# Patient Record
Sex: Male | Born: 1975 | Race: White | Hispanic: No | Marital: Married | Smoking: Never smoker
Health system: Southern US, Community
[De-identification: ages and names within clinical notes are randomized; demographics above are authoritative.]

## PROBLEM LIST (undated history)

## (undated) DIAGNOSIS — J45909 Unspecified asthma, uncomplicated: Secondary | ICD-10-CM

## (undated) DIAGNOSIS — D229 Melanocytic nevi, unspecified: Secondary | ICD-10-CM

## (undated) DIAGNOSIS — J309 Allergic rhinitis, unspecified: Secondary | ICD-10-CM

## (undated) HISTORY — PX: CYST REMOVAL NECK: SHX6281

## (undated) HISTORY — DX: Allergic rhinitis, unspecified: J30.9

## (undated) HISTORY — DX: Melanocytic nevi, unspecified: D22.9

## (undated) HISTORY — DX: Unspecified asthma, uncomplicated: J45.909

---

## 2010-10-05 ENCOUNTER — Other Ambulatory Visit: Payer: Self-pay | Admitting: Family Medicine

## 2010-10-06 ENCOUNTER — Ambulatory Visit
Admission: RE | Admit: 2010-10-06 | Discharge: 2010-10-06 | Disposition: A | Discharge status: Home / Self Care | Payer: PRIVATE HEALTH INSURANCE | Source: Ambulatory Visit | Attending: Family Medicine | Admitting: Family Medicine

## 2012-05-29 IMAGING — RF DG ESOPHAGUS
4 series · 20 of 24 positions shown · non-contrast
Comparison: None.

CLINICAL DATA: Reflux

ESOPHOGRAM/BARIUM SWALLOW
TECHNIQUE: Combined double contrast and single contrast
examination performed using effervescent crystals, thick barium
liquid, and thin barium liquid.

[Series 1: run · 5 of 19 slices shown (1 of 4)]
[im 1/19]
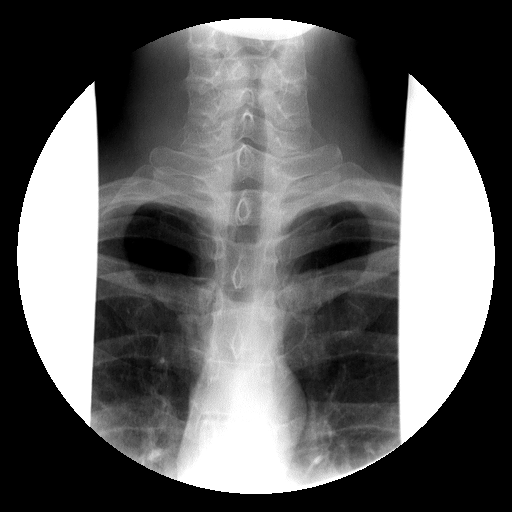
[im 4/19]
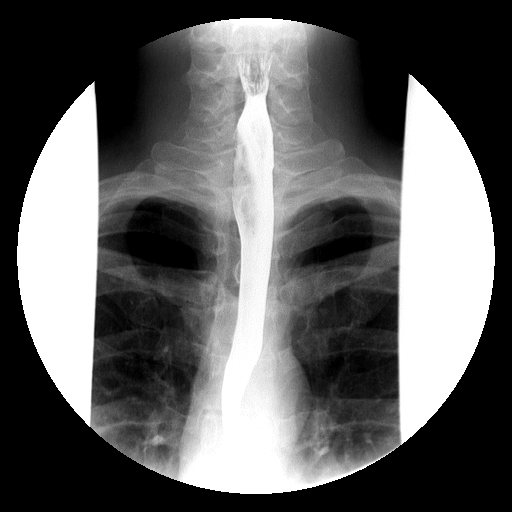
[im 11/19]
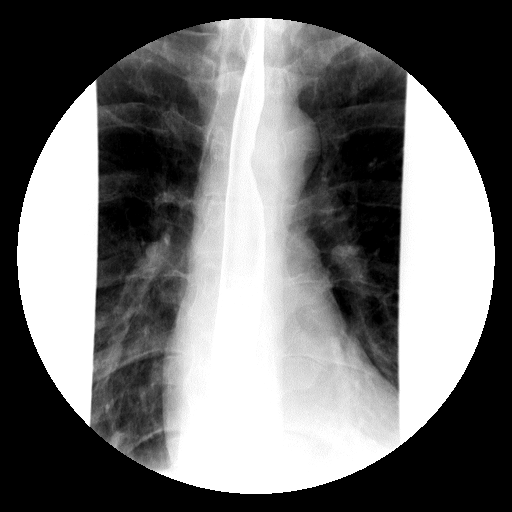
[im 15/19]
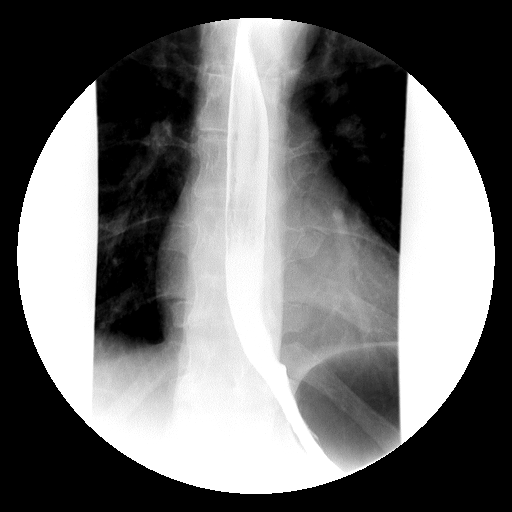
[im 19/19]
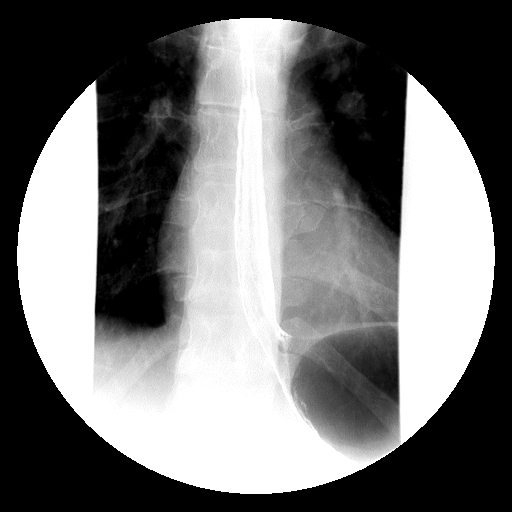

[Series 2: run · 6 of 23 slices shown (2 of 4)]
[im 1/23]
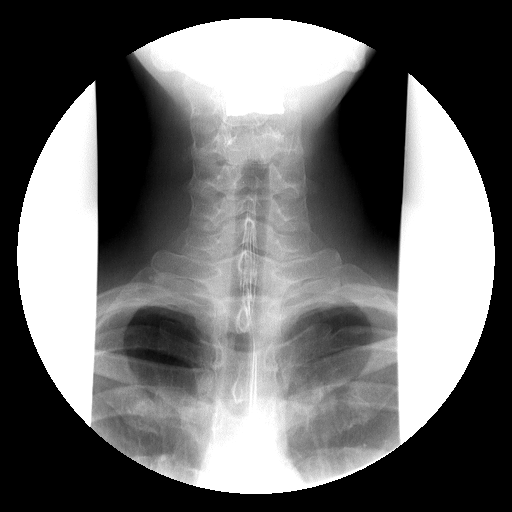
[im 4/23]
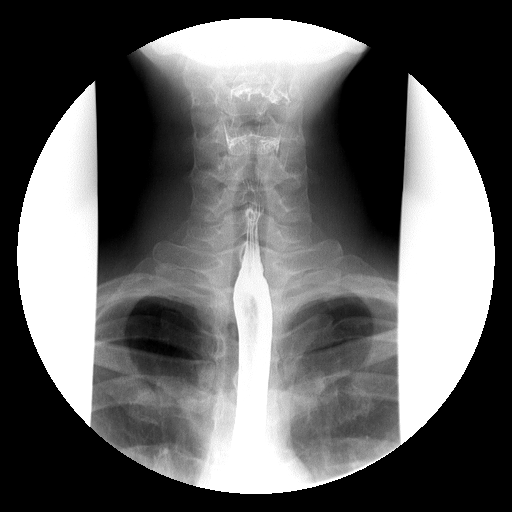
[im 12/23]
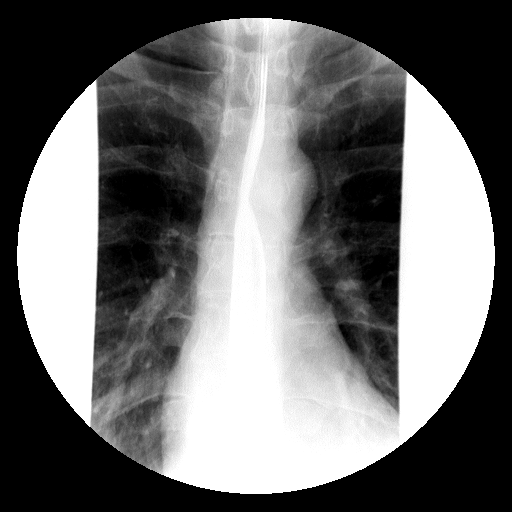
[im 15/23]
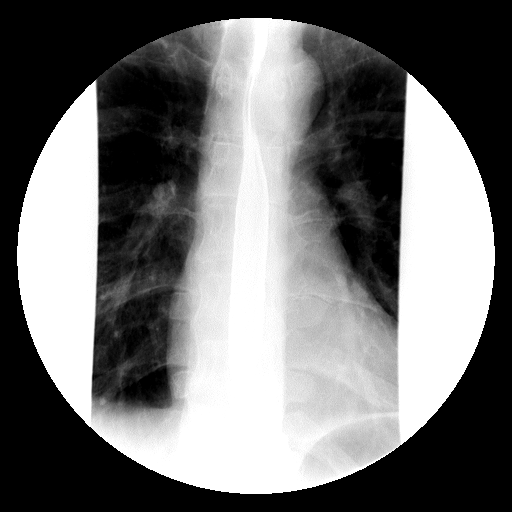
[im 19/23]
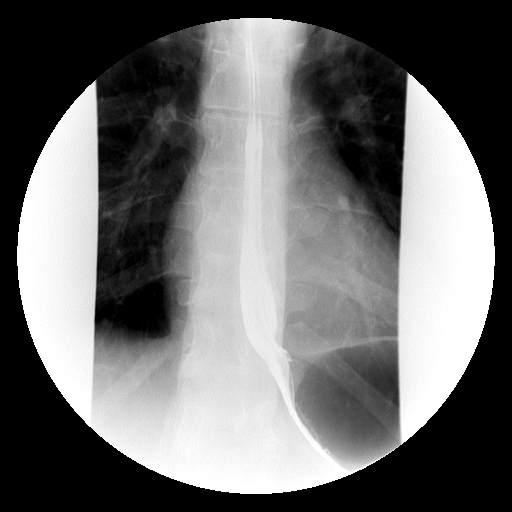
[im 23/23]
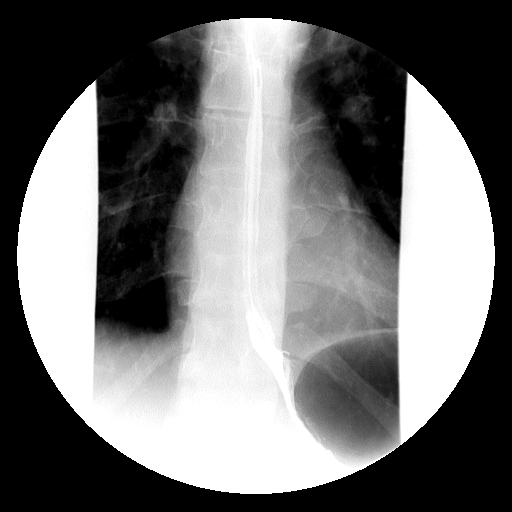

[Series 3: run · 4 of 18 slices shown (3 of 4)]
[im 1/18]
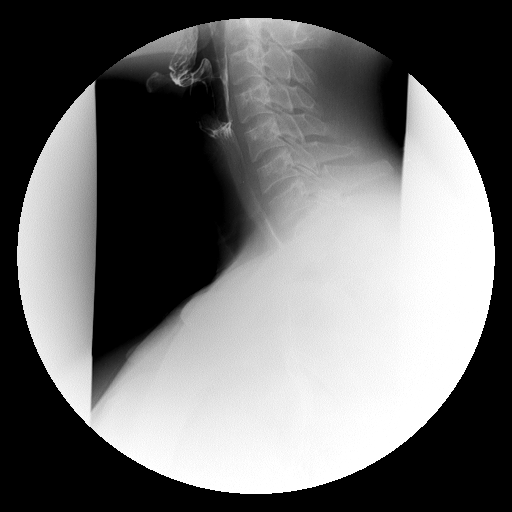
[im 9/18]
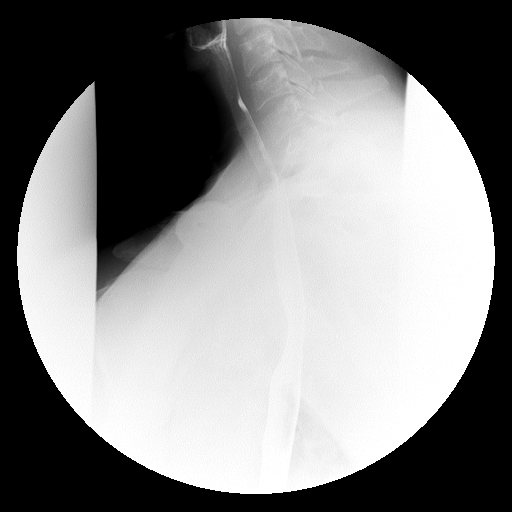
[im 13/18]
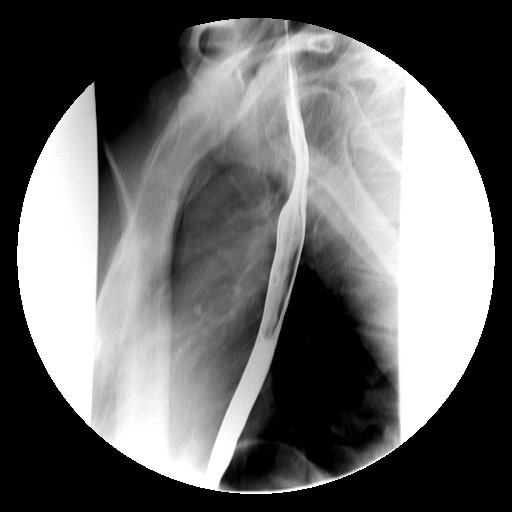
[im 18/18]
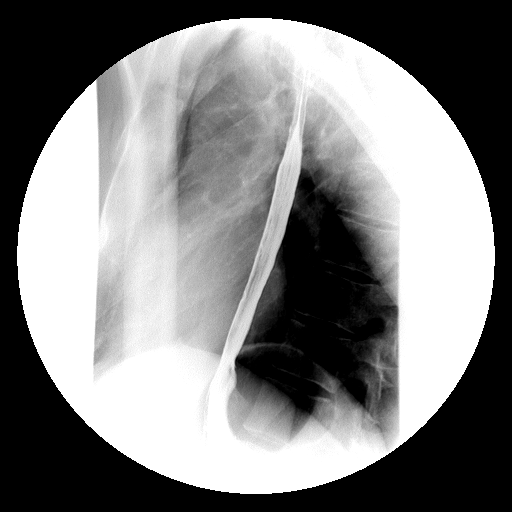

[Series 4: run · 5 of 21 slices shown (4 of 4)]
[im 1/21]
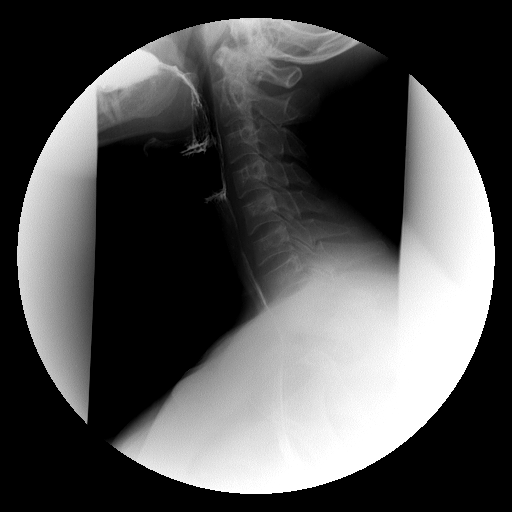
[im 5/21]
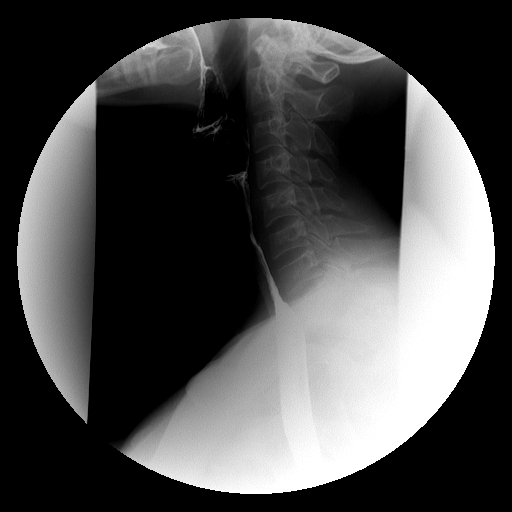
[im 13/21]
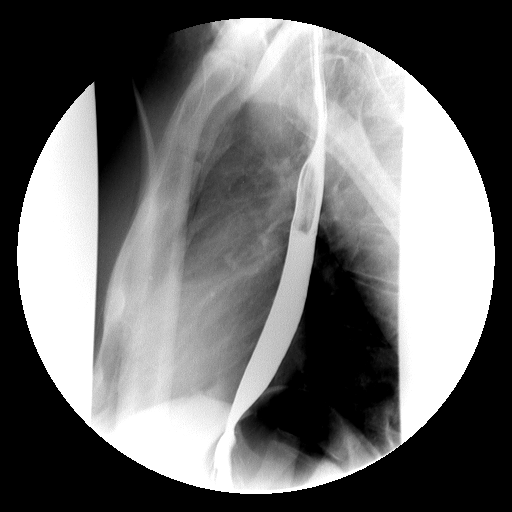
[im 17/21]
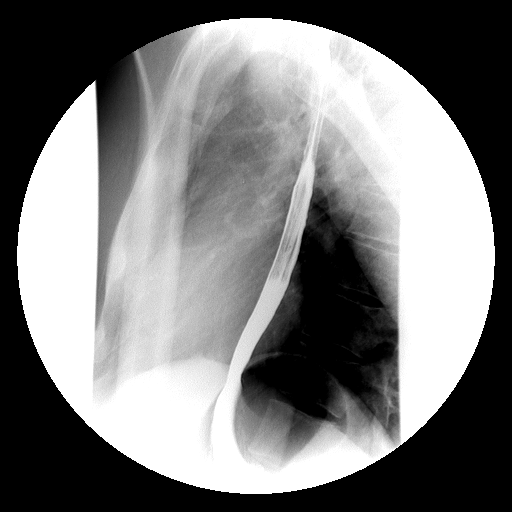
[im 21/21]
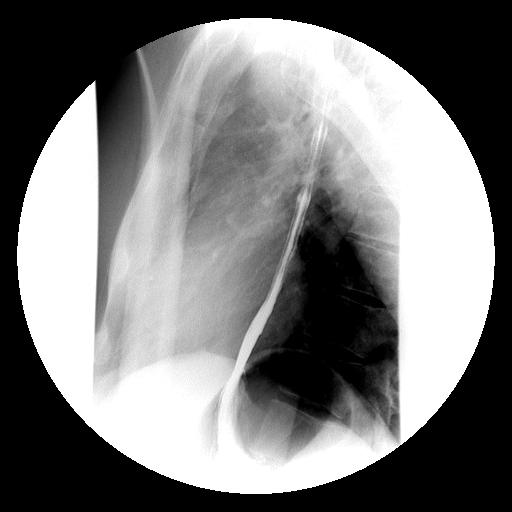

[20 of 24 positions shown; findings below may reference images not displayed]

FINDINGS: The esophagus has a normal appearance with no evidence
of stricture, ulceration, fixed filling defect.  The mucosa is
normal appearing.  There is no narrowing at the EG junction.  No
reflux was seen during this exam.
IMPRESSION: Normal esophagram.

## 2016-04-19 ENCOUNTER — Encounter (INDEPENDENT_AMBULATORY_CARE_PROVIDER_SITE_OTHER): Payer: Self-pay | Admitting: Orthopaedic Surgery

## 2016-04-19 ENCOUNTER — Ambulatory Visit (INDEPENDENT_AMBULATORY_CARE_PROVIDER_SITE_OTHER): Payer: Self-pay

## 2016-04-19 ENCOUNTER — Ambulatory Visit (INDEPENDENT_AMBULATORY_CARE_PROVIDER_SITE_OTHER): Payer: BLUE CROSS/BLUE SHIELD | Admitting: Orthopaedic Surgery

## 2016-04-19 VITALS — BP 131/94 | HR 75 | Ht 72.0 in | Wt 165.0 lb

## 2016-04-19 DIAGNOSIS — M25512 Pain in left shoulder: Secondary | ICD-10-CM | POA: Diagnosis not present

## 2016-04-19 DIAGNOSIS — M542 Cervicalgia: Secondary | ICD-10-CM | POA: Diagnosis not present

## 2016-04-19 NOTE — Progress Notes (Signed)
Office Visit Note   Patient: Christian Carroll           Date of Birth: Dec 24, 1975           MRN: [id number] Visit Date: 04/19/2016              Requested by: No referring provider defined for this encounter. PCP: Stephens Shire, MD   Assessment & Plan: Visit Diagnoses:  1. Neck pain   2. Left shoulder pain, unspecified chronicity     Plan: Return if he develops radicular symptoms. We discussed these in detail. We discussed working on some stress  induction with the walking program cardiovascular fitness work. Based on his physical examination findings and do not think MRI is indicated at this point.  Follow-Up Instructions: Return if symptoms worsen or fail to improve.   Orders:  Orders Placed This Encounter  Procedures  . XR Cervical Spine 2 or 3 views  . XR Shoulder Left   No orders of the defined types were placed in this encounter.     Procedures: No procedures performed   Clinical Data: No additional findings.   Subjective: Chief Complaint  Patient presents with  . Left Shoulder - Pain  . Neck - Pain    Patient has been having some soreness in the muscle in his left shoulder and up into left neck. This is ongoing x about 3 years. There is no known injury.  He states that this gets inflamed and tight. He does try to do some stretching which seems to help.  He states it is more of a discomfort than a pain.  He states if he does a lot of overhead work, he can feel that muscle left side across anterior neck. Stretching his arms back seems to make it feel better. He may take an occasional Ibuprofen if needed. He saw a chiropractor about 2 years ago, which did provide some relief.  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  Review of Systems 14 point review of systems updated reviewed and is normal other than as mentioned in this   Objective: Vital Signs: BP (!) 131/94   Pulse 75   Ht 6'  (1.829 m)   Wt 165 lb (74.8 kg)   BMI 22.38 kg/m   Physical Exam  Constitutional: He is oriented to person, place, and time. He appears well-developed and well-nourished.  HENT:  Head: Normocephalic and atraumatic.  Eyes: EOM are normal. Pupils are equal, round, and reactive to light.  Neck: No tracheal deviation present. No thyromegaly present.  Cardiovascular: Normal rate.   Pulmonary/Chest: Effort normal. He has no wheezes.  Abdominal: Soft. Bowel sounds are normal.  Neurological: He is alert and oriented to person, place, and time.  Skin: Skin is warm and dry. Capillary refill takes less than 2 seconds.  Psychiatric: He has a normal mood and affect. His behavior is normal. Judgment and thought content normal.    Ortho Exam normal cervical range of motion Spurling negative for me. In flexion-extension. Negative impingement of the shoulder. Negative drop arm test empty  can test is normal. Normal heel to toe gait reflexes are normal no isolated motor weakness. No evidence of radiculopathy. Elbow reached full extension no synovitis no skin changes  Specialty Comments:  No specialty comments available.  Imaging: Xr Cervical Spine 2 Or 3 Views  Result Date: 04/19/2016 Cervical spine x-rays AP and lateral reviewed. Mild peaking of the uncovertebral joints  at C5-6. Normal cervical lordosis no disc space narrowing no spondylolisthesis Impression: Essentially normal cervical spine for his age. Trace changes at C5-6  Xr Shoulder Left  Result Date: 04/19/2016 Left shoulder x-rays AP and lateral oblique view are reviewed. This shows normal shoulder joint bony contour is normal no arthritic changes. Before meals joint is normal Impression :left shoulder x-rays    PMFS History: There are no active problems to display for this patient.  History reviewed. No pertinent past medical history.  History reviewed. No pertinent family history.  History reviewed. No pertinent surgical  history. Social History   Occupational History  . Not on file.   Social History Main Topics  . Smoking status: Never Smoker  . Smokeless tobacco: Never Used  . Alcohol use Yes     Comment: occasional  . Drug use: No  . Sexual activity: Not on file

## 2016-05-03 ENCOUNTER — Telehealth (INDEPENDENT_AMBULATORY_CARE_PROVIDER_SITE_OTHER): Payer: Self-pay | Admitting: Orthopaedic Surgery

## 2016-05-03 NOTE — Telephone Encounter (Signed)
Patient request disc of neck & upper spine xray.

## 2016-05-05 NOTE — Telephone Encounter (Signed)
Patient is aware that CD has been made and ready for pick up.

## 2017-12-18 DIAGNOSIS — D229 Melanocytic nevi, unspecified: Secondary | ICD-10-CM

## 2017-12-18 HISTORY — DX: Melanocytic nevi, unspecified: D22.9

## 2018-09-12 ENCOUNTER — Encounter: Payer: Self-pay | Admitting: *Deleted

## 2019-05-06 ENCOUNTER — Ambulatory Visit: Payer: 59 | Admitting: Pediatrics

## 2019-05-06 ENCOUNTER — Encounter: Payer: Self-pay | Admitting: Pediatrics

## 2019-05-06 ENCOUNTER — Other Ambulatory Visit: Payer: Self-pay

## 2019-05-06 VITALS — BP 130/78 | HR 74 | Temp 97.8°F | Resp 16 | Ht 71.0 in | Wt 162.0 lb

## 2019-05-06 DIAGNOSIS — J3089 Other allergic rhinitis: Secondary | ICD-10-CM

## 2019-05-06 DIAGNOSIS — J4530 Mild persistent asthma, uncomplicated: Secondary | ICD-10-CM

## 2019-05-06 DIAGNOSIS — H1010 Acute atopic conjunctivitis, unspecified eye: Secondary | ICD-10-CM | POA: Diagnosis not present

## 2019-05-06 MED ORDER — ALBUTEROL SULFATE HFA 108 (90 BASE) MCG/ACT IN AERS: 2.0000 | INHALATION_SPRAY | RESPIRATORY_TRACT | 1 refills | Status: DC | PRN

## 2019-05-06 MED ORDER — MONTELUKAST SODIUM 10 MG PO TABS: ORAL_TABLET | ORAL | 5 refills | Status: DC

## 2019-05-06 NOTE — Patient Instructions (Addendum)
Environmental control of dust and mold Zyrtec 10 mg-take 1 tablet once a day if needed for runny nose or itchy eyes Fluticasone 2 sprays per nostril once a day if needed for stuffy nose Opcon-A-1 drop 3 times a day if needed for itchy eyes  Montelukast 10 mg-take 1 tablet once a day to prevent coughing or wheezing Ventolin 2 puffs every 4 hours if needed for wheezing or coughing spells.  You may use Ventolin 2 puffs 5 to 15 minutes before exercise Add prednisone 20 mg twice a day for 3 days, 20 mg on the fourth day, 10 mg on the fifth day to bring your allergic symptoms under control  Polysporin ointment twice a day for 1 week in  the areas that have a little scabbing after working outside.

## 2019-05-06 NOTE — Progress Notes (Signed)
[address] [place] [address] Dept: [phone]  New Patient Note  Patient ID: Christian Carroll, male    DOB: Nov 01, 1975  Age: 43 y.o. MRN: [id number] Date of Office Visit: 05/06/2019 Referring provider: Stephens Shire, MD [address] [place],  [place] [address]    Chief Complaint: Allergic Rhinitis  (history of allergy injections as a child) and Asthma  HPI Christian Carroll presents for for an allergy evaluation.  He had asthma in childhood and his symptoms improved dramatically with the use of allergy injections.  During the past few months he has noted some wheezing and shortness of breath.  He has had seasonal allergic rhinitis since childhood and usually Zyrtec D improves his symptoms.  He has aggravation of his symptoms on exposure to dust , cigarette smoke , molds , cat and dog.  He does not have a history of eczema or chronic urticaria.  He has never had pneumonia.  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   HENT:       Seasonal allergic rhinitis since childhood  Eyes:       Itchy at times  Respiratory:       Asthma as a child.  Some coughing and wheezing recently  Cardiovascular: Negative.   Gastrointestinal:       Occasional heartburn treated with Prilosec as needed  Genitourinary: Negative.   Musculoskeletal: Negative.   Skin:       Photosensitive reaction to NSAIDs  Neurological: Negative.   Endo/Heme/Allergies:       No diabetes or thyroid disease.  As a young child he was stung in one  foot and had large local swelling only.  Subsequent insect stings have not caused any problems.  Psychiatric/Behavioral: Negative.     Outpatient Encounter Medications as of 05/06/2019  Medication Sig  . cetirizine-pseudoephedrine (ZYRTEC-D) 5-120 MG tablet Take 1 tablet by mouth 2 (two) times daily.  . fluticasone (FLONASE) 50 MCG/ACT nasal spray Place into the  nose.  . ibuprofen (ADVIL,MOTRIN) 200 MG tablet Take 200 mg by mouth every 6 (six) hours as needed. prn  . albuterol (VENTOLIN HFA) 108 (90 Base) MCG/ACT inhaler Inhale 2 puffs into the lungs every 4 (four) hours as needed for wheezing or shortness of breath.  . montelukast (SINGULAIR) 10 MG tablet Take 1 tablet once a day for coughing or wheezing.  . [DISCONTINUED] fluticasone (FLONASE) 50 MCG/ACT nasal spray Place into both nostrils daily.   No facility-administered encounter medications on file as of 05/06/2019.      Drug Allergies:  No Known Allergies  Family History: Trevious's family history is not on file..  Family history is positive for hayfever and sinus problems.  Family history is negative for asthma, angioedema, eczema, hives, food allergies, chronic bronchitis or emphysema.  Social and environmental.  He has a cat that comes in and out of his house.  He is not exposed to cigarette smoking.  He has never smoked cigarettes.  He is an Secondary school teacher.  His house was built in 1901    Physical Exam: BP 130/78 (BP Location: Left Arm, Patient Position: Sitting, Cuff Size: Normal)   Pulse 74   Temp 97.8 F (36.6 C)   Resp 16   Ht 5\' 11"  (1.803 m)   Wt 162 lb 0.6 oz (73.5 kg)   SpO2 97%   BMI 22.60 kg/m    Physical Exam Vitals signs reviewed.  Constitutional:      Appearance: Normal appearance. He is normal weight.  HENT:     Head:     Comments: Eyes normal.  Ears normal.  Nose mild swelling of the nasal turbinates.  Pharynx normal. Neck:     Musculoskeletal: Neck supple.     Comments: No thyromegaly Cardiovascular:     Rate and Rhythm: Normal rate and regular rhythm.     Comments: S1-S2 normal no murmurs Pulmonary:     Comments: Clear to percussion and auscultation Abdominal:     Palpations: Abdomen is soft.     Tenderness: There is no abdominal tenderness.     Comments: No hepatosplenomegaly  Lymphadenopathy:     Cervical: No cervical adenopathy.  Skin:     Comments: About 3 erythematous papulosquamous areas in right neck and thorax after he did a lot of work outside.  These areas were initially itchy  Neurological:     General: No focal deficit present.     Mental Status: He is alert and oriented to person, place, and time. Mental status is at baseline.  Psychiatric:        Mood and Affect: Mood normal.        Behavior: Behavior normal.        Thought Content: Thought content normal.        Judgment: Judgment normal.     Diagnostics: FVC 5.82 L FEV1 4.54 L.  Predicted FVC 5.40 L predicted FEV1 4.27 L.  After albuterol 2 puffs FVC 5.94 L FEV1 4.62 L-the spirometry is in the normal range and there was no significant improvement after albuterol  Allergy skin test were positive to grass pollens, weed pollen, tree pollens, molds.  Mild reactivity to dust mite   Assessment  Assessment and Plan: 1. Mild persistent asthma without complication   2. Other allergic rhinitis   3. Seasonal allergic conjunctivitis     Meds ordered this encounter  Medications  . albuterol (VENTOLIN HFA) 108 (90 Base) MCG/ACT inhaler    Sig: Inhale 2 puffs into the lungs every 4 (four) hours as needed for wheezing or shortness of breath.    Dispense:  18 g    Refill:  1  . montelukast (SINGULAIR) 10 MG tablet    Sig: Take 1 tablet once a day for coughing or wheezing.    Dispense:  34 tablet    Refill:  5    Patient Instructions  Environmental control of dust and mold Zyrtec 10 mg-take 1 tablet once a day if needed for runny nose or itchy eyes Fluticasone 2 sprays per nostril once a day if needed for stuffy nose Opcon-A-1 drop 3 times a day if needed for itchy eyes  Montelukast 10 mg-take 1 tablet once a day to prevent coughing or wheezing Ventolin 2 puffs every 4 hours if needed for wheezing or coughing spells.  You may use Ventolin 2 puffs 5 to 15 minutes before exercise Add prednisone 20 mg twice a day for 3 days, 20 mg on the fourth day, 10 mg on the  fifth day to bring your allergic symptoms under control  Polysporin ointment twice a day for 1 week in  the areas that have a little scabbing after working outside.   Return in about 4 weeks (around 06/03/2019).   Thank you for the opportunity to care for this patient.  Please do not hesitate to contact me with questions.  Penne Lash, M.D.  [facility]  [address] [place], [place] [address] [phone]

## 2019-06-03 ENCOUNTER — Other Ambulatory Visit: Payer: Self-pay

## 2019-06-03 ENCOUNTER — Encounter: Payer: Self-pay | Admitting: Pediatrics

## 2019-06-03 ENCOUNTER — Ambulatory Visit: Payer: 59 | Admitting: Pediatrics

## 2019-06-03 DIAGNOSIS — H1010 Acute atopic conjunctivitis, unspecified eye: Secondary | ICD-10-CM | POA: Diagnosis not present

## 2019-06-03 DIAGNOSIS — J4530 Mild persistent asthma, uncomplicated: Secondary | ICD-10-CM | POA: Diagnosis not present

## 2019-06-03 DIAGNOSIS — J3089 Other allergic rhinitis: Secondary | ICD-10-CM

## 2019-06-03 NOTE — Patient Instructions (Signed)
Zyrtec 10 mg-take 1 tablet once a day if needed for runny nose or itchy eyes or instead Zyrtec-D Fluticasone 2 sprays per nostril once a day if needed for stuffy nose Opcon-A-1 drop 3 times a day if needed for itchy eyes  Montelukast 10 mg-take 1 tablet once a day to prevent coughing or wheezing Ventolin 2 puffs every 4 hours if needed for wheezing or coughing spells.  You may use Ventolin 2 puffs 5 to 15 minutes before exercise  Call us if you are not doing well on this treatment plan

## 2019-06-03 NOTE — Progress Notes (Signed)
  [address] [place] [address] Dept: [phone]  FOLLOW UP NOTE  Patient ID: Christian Carroll, male    DOB: 27-Sep-1975  Age: 43 y.o. MRN: [id number] Date of Office Visit: 06/03/2019  Assessment  Chief Complaint: Allergic Rhinitis  (doing ok. still has to use the zyrtec D at times bc of stuffiness or congestion)  HPI Christian Carroll presents for follow-up of asthma and allergic rhinitis.  Usually he does well by using Zyrtec 10 mg once a day and fluticasone 2 sprays per nostril once a day with regard to his nasal congestion.  Sometimes the nasal congestion is severe and he uses Zyrtec-D.  Instead of Zyrtec.  He has had a flu vaccination.  By  taking montelukast 10 mg once a day he does not have any coughing or wheezing.  He very rarely has to use Ventolin.   Drug Allergies:  No Known Allergies  Physical Exam: There were no vitals taken for this visit.   Physical Exam Vitals reviewed.  Constitutional:      Appearance: Normal appearance. He is normal weight.  HENT:     Head:     Comments: Eyes normal.  Ears normal.  Nose normal.  Pharynx normal. Cardiovascular:     Comments: S1-S2 normal no murmurs Pulmonary:     Comments: Clear to percussion and auscultation Musculoskeletal:     Cervical back: Neck supple.  Lymphadenopathy:     Cervical: No cervical adenopathy.  Neurological:     General: No focal deficit present.     Mental Status: He is alert and oriented to person, place, and time. Mental status is at baseline.  Psychiatric:        Mood and Affect: Mood normal.        Behavior: Behavior normal.        Thought Content: Thought content normal.        Judgment: Judgment normal.     Diagnostics: FVC 5.68 L FEV1 4.51 L.  Predicted FVC 5.40 L predicted FEV1 4.27 L-the spirometry is in the normal range  Assessment and Plan: 1. Mild persistent asthma without complication   2. Other allergic rhinitis   3. Seasonal allergic conjunctivitis     No orders of  the defined types were placed in this encounter.   Patient Instructions  Zyrtec 10 mg-take 1 tablet once a day if needed for runny nose or itchy eyes or instead Zyrtec-D Fluticasone 2 sprays per nostril once a day if needed for stuffy nose Opcon-A-1 drop 3 times a day if needed for itchy eyes  Montelukast 10 mg-take 1 tablet once a day to prevent coughing or wheezing Ventolin 2 puffs every 4 hours if needed for wheezing or coughing spells.  You may use Ventolin 2 puffs 5 to 15 minutes before exercise  Call us if you are not doing well on this treatment plan   Return in about 6 months (around 12/02/2019).    Thank you for the opportunity to care for this patient.  Please do not hesitate to contact me with questions.  Penne Lash, M.D.  [facility] [address] [place], [place] [address] [phone]

## 2019-08-24 ENCOUNTER — Ambulatory Visit: Payer: PRIVATE HEALTH INSURANCE | Attending: Internal Medicine

## 2019-08-24 DIAGNOSIS — Z23 Encounter for immunization: Secondary | ICD-10-CM | POA: Insufficient documentation

## 2019-08-24 NOTE — Progress Notes (Signed)
   Covid-19 Vaccination Clinic  Name:  Christian Carroll    MRN: [id number] DOB: 11/16/1975  08/24/2019  Christian Carroll was observed post Covid-19 immunization for 15 minutes without incident. He was provided with Vaccine Information Sheet and instruction to access the V-Safe system.   Christian Carroll was instructed to call 911 with any severe reactions post vaccine: Marland Kitchen Difficulty breathing  . Swelling of face and throat  . A fast heartbeat  . A bad rash all over body  . Dizziness and weakness   Immunizations Administered    Name Date Dose VIS Date Route   Pfizer COVID-19 Vaccine 08/24/2019 12:21 PM 0.3 mL 05/30/2019 Intramuscular   Manufacturer: [place]   Lot: [id number]   [place]: [id number]

## 2019-09-23 ENCOUNTER — Ambulatory Visit: Payer: PRIVATE HEALTH INSURANCE | Attending: Internal Medicine

## 2019-09-23 DIAGNOSIS — Z23 Encounter for immunization: Secondary | ICD-10-CM

## 2019-09-23 NOTE — Progress Notes (Signed)
   Covid-19 Vaccination Clinic  Name:  Christian Carroll    MRN: [id number] DOB: Mar 20, 1976  09/23/2019  Mr. Christian Carroll was observed post Covid-19 immunization for 15 minutes without incident. He was provided with Vaccine Information Sheet and instruction to access the V-Safe system.   Mr. Christian Carroll was instructed to call 911 with any severe reactions post vaccine: Marland Kitchen Difficulty breathing  . Swelling of face and throat  . A fast heartbeat  . A bad rash all over body  . Dizziness and weakness   Immunizations Administered    Name Date Dose VIS Date Route   Pfizer COVID-19 Vaccine 09/23/2019 11:55 AM 0.3 mL 05/30/2019 Intramuscular   Manufacturer: [place]   Lot: [id number]   [place]: [id number]

## 2019-10-02 ENCOUNTER — Other Ambulatory Visit: Payer: Self-pay | Admitting: Pediatrics

## 2019-10-30 ENCOUNTER — Other Ambulatory Visit: Payer: Self-pay | Admitting: Pediatrics

## 2019-11-12 NOTE — Patient Instructions (Addendum)
Allergic rhinitis Stop Zyrtec and Zyrtec-D Start Xyzal 5 mg take 1 tablet once a day as needed for runny nose and itching. Stop.fluticasone nasal spray Start XHANCE nasal spray doing 2 sprays each nostril twice a day to help with nasal congestion.  Allergic conjunctivitis Continue Opcon-A using 1 drop 3 times a day as needed for itchy watery eyes  Asthma Start ArmonAir Digihailer 55 mcg using 1 puff every 12 hours to help prevent wheeze, shortness of breath and cough. We will re-evaluate this at your 1 month follow up to see if this helps with shortness of breath and wheeze. Continue montelukast 10 mg once a day to help prevent cough and wheeze Use ProAir 2 puffs every 4 hours as needed for coughing, wheezing, tightness in chest or shortness of breath.  Also may use 2 puffs 5 to 15 minutes prior to exercise.  Please let us know if this treatment plan is not working well for you. Continue all other scheduled medications  Schedule follow-up appointment in 1 month

## 2019-11-13 ENCOUNTER — Other Ambulatory Visit: Payer: Self-pay

## 2019-11-13 ENCOUNTER — Ambulatory Visit: Payer: 59 | Admitting: Family Medicine

## 2019-11-13 ENCOUNTER — Encounter: Payer: Self-pay | Admitting: Family Medicine

## 2019-11-13 VITALS — BP 130/82 | HR 76 | Temp 98.5°F | Resp 18 | Ht 72.0 in | Wt 167.2 lb

## 2019-11-13 DIAGNOSIS — J302 Other seasonal allergic rhinitis: Secondary | ICD-10-CM | POA: Diagnosis not present

## 2019-11-13 DIAGNOSIS — H1013 Acute atopic conjunctivitis, bilateral: Secondary | ICD-10-CM | POA: Diagnosis not present

## 2019-11-13 DIAGNOSIS — J4530 Mild persistent asthma, uncomplicated: Secondary | ICD-10-CM

## 2019-11-13 DIAGNOSIS — J3089 Other allergic rhinitis: Secondary | ICD-10-CM

## 2019-11-13 MED ORDER — LEVOCETIRIZINE DIHYDROCHLORIDE 5 MG PO TABS: ORAL_TABLET | ORAL | 5 refills | Status: DC

## 2019-11-13 MED ORDER — ARMONAIR DIGIHALER 55 MCG/ACT IN AEPB: INHALATION_SPRAY | RESPIRATORY_TRACT | 5 refills | Status: DC

## 2019-11-13 MED ORDER — XHANCE 93 MCG/ACT NA EXHU
2.0000 | INHALANT_SUSPENSION | Freq: Two times a day (BID) | NASAL | 5 refills | Status: DC
Start: 2019-11-13 — End: 2019-12-11

## 2019-11-13 MED ORDER — MONTELUKAST SODIUM 10 MG PO TABS: ORAL_TABLET | ORAL | 5 refills | Status: DC

## 2019-11-13 MED ORDER — ALBUTEROL SULFATE HFA 108 (90 BASE) MCG/ACT IN AERS
2.0000 | INHALATION_SPRAY | RESPIRATORY_TRACT | 1 refills | Status: DC | PRN
Start: 2019-11-13 — End: 2020-10-28

## 2019-11-13 NOTE — Progress Notes (Addendum)
[address] [place] [address] Dept: [phone]  FOLLOW UP NOTE  Patient ID: Christian Carroll, male    DOB: 11/14/1975  Age: 44 y.o. MRN: [id number] Date of Office Visit: 11/13/2019  Assessment  Chief Complaint: Asthma  HPI [name] is a 44 year old male that presents for follow-up of allergic rhinitis, allergic conjunctivitis and asthma.  He was last seen on June 03, 2019 by Dr. Shaune Leeks.  Allergic rhinitis is reported as not well controlled.  He reports nasal congestion, postnasal drip, occasional sneezing and denies rhinorrhea.  During pollen season he is having to use Zyrtec D in an on and off pattern to help with nasal congestion.  When he tries to switch to Zyrtec it does not help with his nasal congestion.  Allergic conjunctivitis is reported as controlled.  He reports occasional itchy watery eyes in which Opcon-A helps resolve the symptoms.  Asthma is reported as moderately controlled with the use of montelukast 10 mg once a day and Ventolin.  He reports occasional wheezing and shortness of breath.  He denies any nocturnal awakenings, coughing or tightness in his chest.  He is having to use his albuterol every day if he is on Zyrtec and not using Zyrtec D  due to wheezing and itching in his throat.  He has not required any systemic steroids or any trips to the emergency room or urgent care since his last office visit.  He reports that Ventolin is no longer going to being covered by his insurance.  He reports rare use of Prilosec for heartburn.  He denies any nighttime symptoms and reports rare flareups of heartburn with coffee in the morning or exercise.  Current medications are as listed in the chart.  Drug Allergies:  No Known Allergies   Physical Exam: BP 130/82 (BP Location: Right Arm, Patient Position: Sitting, Cuff Size: Normal)   Pulse 76   Temp 98.5 F (36.9 C) (Oral)   Resp 18   Ht 6' (1.829 m)   Wt 167 lb 3.2 oz (75.8 kg)   SpO2 99%    BMI 22.68 kg/m    Physical Exam Constitutional:      Appearance: Normal appearance.  HENT:     Head: Normocephalic and atraumatic.     Comments: Pharynx normal. Eyes normal. Ears normal. Nose: turbinates moderately edematous and erythematous- Right nostril greater than left nostril.    Right Ear: Tympanic membrane, ear canal and external ear normal.     Left Ear: Tympanic membrane, ear canal and external ear normal.     Mouth/Throat:     Mouth: Mucous membranes are moist.     Pharynx: Oropharynx is clear.  Eyes:     Conjunctiva/sclera: Conjunctivae normal.  Cardiovascular:     Rate and Rhythm: Normal rate and regular rhythm.     Heart sounds: Normal heart sounds.  Pulmonary:     Effort: Pulmonary effort is normal.     Breath sounds: Normal breath sounds.     Comments: Lungs clear to auscultation. Musculoskeletal:     Cervical back: Neck supple.  Skin:    General: Skin is warm and dry.  Neurological:     Mental Status: He is alert and oriented to person, place, and time.  Psychiatric:        Mood and Affect: Mood normal.        Behavior: Behavior normal.        Thought Content: Thought content normal.        Judgment:  Judgment normal.     Diagnostics: FVC 5.88 L, FEV1 4.53 L.  Predicted FVC 5.58 L, FEV1 4.40 L.  Spirometry indicates normal ventilatory function.  Assessment and Plan: 1. Mild persistent asthma without complication   2. Seasonal and perennial allergic rhinitis   3. Allergic conjunctivitis of both eyes     No orders of the defined types were placed in this encounter.   Patient Instructions  Allergic rhinitis Stop Zyrtec and Zyrtec-D Start Xyzal 5 mg take 1 tablet once a day as needed for runny nose and itching. Stop.fluticasone nasal spray Start XHANCE nasal spray doing 2 sprays each nostril twice a day to help with nasal congestion.  Allergic conjunctivitis Continue Opcon-A using 1 drop 3 times a day as needed for itchy watery  eyes  Asthma Start ArmonAir Digihailer 55 mcg using 1 puff every 12 hours to help prevent wheeze, shortness of breath and cough. Continue montelukast 10 mg once a day to help prevent cough and wheeze Use ProAir 2 puffs every 4 hours as needed for coughing, wheezing, tightness in chest or shortness of breath.  Also may use 2 puffs 5 to 15 minutes prior to exercise  Please let us know if this treatment plan is not working well for you. Continue all other scheduled medications  Schedule follow-up appointment in 1 month   Return in about 4 weeks (around 12/11/2019), or if symptoms worsen or fail to improve.    Thank you for the opportunity to care for this patient.  Please do not hesitate to contact me with questions.  Althea Charon, FNP [facility]  ________________________________________________  I have provided oversight concerning [facility]'s evaluation and treatment of this patient's health issues addressed during today's encounter.  I agree with the assessment and therapeutic plan as outlined in the note.   Signed,   R Edgar Frisk, MD

## 2019-11-14 ENCOUNTER — Telehealth: Payer: Self-pay | Admitting: *Deleted

## 2019-11-14 MED ORDER — ARMONAIR RESPICLICK 113 113 MCG/ACT IN AEPB: 1.0000 | INHALATION_SPRAY | Freq: Two times a day (BID) | RESPIRATORY_TRACT | 5 refills | Status: DC

## 2019-11-14 NOTE — Telephone Encounter (Signed)
Can we please change to armonAir 113 with the same instructions

## 2019-11-14 NOTE — Telephone Encounter (Signed)
Pharmacy faxing [place] to say the armonair digihaler 16 has been discontinued. Please advise alternative.

## 2019-11-14 NOTE — Telephone Encounter (Signed)
Okay med sent

## 2019-11-14 NOTE — Telephone Encounter (Signed)
That one is not prefrred also, Armonair [id number] respiclick is preferred.

## 2019-11-14 NOTE — Telephone Encounter (Signed)
Perfect

## 2019-11-18 ENCOUNTER — Ambulatory Visit: Payer: 59 | Admitting: Pediatrics

## 2019-11-18 ENCOUNTER — Telehealth: Payer: Self-pay | Admitting: Family Medicine

## 2019-11-18 NOTE — Telephone Encounter (Signed)
Please advise to another alternative or call drug rep?

## 2019-11-18 NOTE — Telephone Encounter (Signed)
Can you please call in Flovent 110-2 puffs twice a day with a spacer to prevent cough or wheeze. Thank you

## 2019-11-18 NOTE — Telephone Encounter (Signed)
[facility] states Fluticasone Propionate, Inhal, (ARMONAIR RESPICLICK [id number])   Is  on back order, need alternate therapy

## 2019-11-19 ENCOUNTER — Other Ambulatory Visit: Payer: Self-pay

## 2019-11-19 MED ORDER — FLOVENT HFA 110 MCG/ACT IN AERO
2.0000 | INHALATION_SPRAY | Freq: Two times a day (BID) | RESPIRATORY_TRACT | 5 refills | Status: DC
Start: 2019-11-19 — End: 2019-12-11

## 2019-11-19 NOTE — Telephone Encounter (Signed)
Sent in rx and informed pt of me doing so he stated understanding on directions

## 2019-12-01 ENCOUNTER — Telehealth: Payer: Self-pay | Admitting: Family Medicine

## 2019-12-01 NOTE — Telephone Encounter (Signed)
Bridgette from [facility] called to check on PA that was sent over 11/18/2019. PA Line to call back is [phone].  Please advise

## 2019-12-02 NOTE — Telephone Encounter (Signed)
Please advise 

## 2019-12-02 NOTE — Telephone Encounter (Signed)
Patient has [place]. He can try and fail flunisolide as that is what is covered by insurance provider if MD would like to do that. If not then he is still eligible to receive the medication just at the 50.00 copay per month.

## 2019-12-08 MED ORDER — FLUNISOLIDE 25 MCG/ACT (0.025%) NA SOLN
NASAL | 5 refills | Status: DC
Start: 2019-12-08 — End: 2020-03-03

## 2019-12-08 NOTE — Telephone Encounter (Signed)
Prescription has been sent and patient is aware.

## 2019-12-08 NOTE — Telephone Encounter (Signed)
Did you receive this?

## 2019-12-08 NOTE — Telephone Encounter (Signed)
We can try flunisolide 2 sprays in each nostril 2-3 times a day as needed for symptoms of allergic rhinitis

## 2019-12-08 NOTE — Addendum Note (Signed)
Addended by: Lucrezia Starch I on: 12/08/2019 01:24 PM   Modules accepted: Orders

## 2019-12-11 ENCOUNTER — Encounter: Payer: Self-pay | Admitting: Family Medicine

## 2019-12-11 ENCOUNTER — Ambulatory Visit (INDEPENDENT_AMBULATORY_CARE_PROVIDER_SITE_OTHER): Payer: 59 | Admitting: Family Medicine

## 2019-12-11 VITALS — BP 134/90 | HR 64 | Resp 20

## 2019-12-11 DIAGNOSIS — J3089 Other allergic rhinitis: Secondary | ICD-10-CM

## 2019-12-11 DIAGNOSIS — J302 Other seasonal allergic rhinitis: Secondary | ICD-10-CM

## 2019-12-11 DIAGNOSIS — K219 Gastro-esophageal reflux disease without esophagitis: Secondary | ICD-10-CM

## 2019-12-11 DIAGNOSIS — H1013 Acute atopic conjunctivitis, bilateral: Secondary | ICD-10-CM | POA: Diagnosis not present

## 2019-12-11 DIAGNOSIS — J4530 Mild persistent asthma, uncomplicated: Secondary | ICD-10-CM | POA: Diagnosis not present

## 2019-12-11 NOTE — Progress Notes (Signed)
[address] [place] [address] Dept: [phone]  FOLLOW UP NOTE  Patient ID: Yu Peggs, male    DOB: 1976-04-14  Age: 44 y.o. MRN: [id number] Date of Office Visit: 12/11/2019  Assessment  Chief Complaint: Asthma  HPI Bretton Tandy is a 44 year old male who presents to the clinic for a follow up visit. He was last seen in the clinic on 11/13/2019 by Althea Charon, FNP, for evaluation of asthma, allergic rhinitis, allergic conjunctivitis, and reflux.  At today's visit, he reports his asthma has been well controlled with no shortness of breath, occasional wheeze with vigorous activity such as running, and no cough.  He continues to take montelukast 10 mg once a day, Flovent 110-2 puffs once a day with a spacer and occasionally 2 puffs twice a day, and uses albuterol before activity.  He reports allergic rhinitis has been better controlled with XHANCE, saline nasal rinse, and levocetirizine. His insurance will not cover Xhance and he will be changing to flunisolide. He reports excellent nasal steroid application technique in the clinic. He does report dry nares occasionally. Reflux is reported as well controlled with omeprazole 20 mg once a day as needed. His current medications are listed in the chart.    Drug Allergies:  No Known Allergies  Physical Exam: BP 134/90   Pulse 64   Resp 20   SpO2 98%    Physical Exam Vitals reviewed.  Constitutional:      Appearance: Normal appearance.  HENT:     Head: Normocephalic and atraumatic.     Right Ear: Tympanic membrane normal.     Left Ear: Tympanic membrane normal.     Nose:     Comments: Bilateral nares slightly erythematous with clear nasal drainage noted.  Pharynx normal.  Ears normal.  Eyes normal.    Mouth/Throat:     Pharynx: Oropharynx is clear.  Eyes:     Conjunctiva/sclera: Conjunctivae normal.  Cardiovascular:     Rate and Rhythm: Normal rate and regular rhythm.     Heart sounds: Normal heart sounds. No  murmur heard.   Pulmonary:     Effort: Pulmonary effort is normal.     Breath sounds: Normal breath sounds.     Comments: Lungs clear to auscultation Musculoskeletal:        General: Normal range of motion.     Cervical back: Normal range of motion and neck supple.  Skin:    General: Skin is warm and dry.  Neurological:     Mental Status: He is alert and oriented to person, place, and time.  Psychiatric:        Mood and Affect: Mood normal.        Behavior: Behavior normal.        Thought Content: Thought content normal.        Judgment: Judgment normal.     Assessment and Plan: 1. Mild persistent asthma without complication   2. Allergic conjunctivitis of both eyes   3. Seasonal and perennial allergic rhinitis   4. Gastroesophageal reflux disease, unspecified whether esophagitis present     No orders of the defined types were placed in this encounter.   Patient Instructions  Asthma Continue Flovent 110-2 puffs once or twice a day to prevent cough or wheeze Continue montelukast 10 mg once a day to help prevent cough and wheeze Use ProAir 2 puffs every 4 hours as needed for coughing, wheezing, tightness in chest or shortness of breath.  Also may  use 2 puffs 5 to 15 minutes prior to exercise.  Allergic rhinitis Continue Xyzal 5 mg take 1 tablet once a day as needed for runny nose and itching. Continue flunisolide nasal spray doing 2 sprays each nostril twice a day to help with nasal congestion. Consider saline nasal rinses as needed for nasal symptoms. Use this before any medicated nasal sprays for best result Continue allergen avoidance measures directed toward pollens, mold, and dust mites as listed below  Allergic conjunctivitis Continue Opcon-A using 1 drop 3 times a day as needed for itchy watery eyes  Reflux Continue dietary and lifestyle modifications as listed below Continue omeprazole 20 mg once a day as needed for reflux  Please let us know if this treatment  plan is not working well for you.  Follow up in 6 months or sooner if needed.   Return in about 6 months (around 06/11/2020), or if symptoms worsen or fail to improve.    Thank you for the opportunity to care for this patient.  Please do not hesitate to contact me with questions.  Gareth Morgan, FNP [facility]

## 2019-12-11 NOTE — Patient Instructions (Signed)
Asthma Continue Flovent 110-2 puffs once or twice a day to prevent cough or wheeze Continue montelukast 10 mg once a day to help prevent cough and wheeze Use ProAir 2 puffs every 4 hours as needed for coughing, wheezing, tightness in chest or shortness of breath.  Also may use 2 puffs 5 to 15 minutes prior to exercise.  Allergic rhinitis Continue Xyzal 5 mg take 1 tablet once a day as needed for runny nose and itching. Continue flunisolide nasal spray doing 2 sprays each nostril twice a day to help with nasal congestion. Consider saline nasal rinses as needed for nasal symptoms. Use this before any medicated nasal sprays for best result Continue allergen avoidance measures directed toward pollens, mold, and dust mites as listed below  Allergic conjunctivitis Continue Opcon-A using 1 drop 3 times a day as needed for itchy watery eyes  Reflux Continue dietary and lifestyle modifications as listed below Continue omeprazole 20 mg once a day as needed for reflux  Please let us know if this treatment plan is not working well for you.  Follow up in 6 months or sooner if needed.  Reducing Pollen Exposure The American Academy of Allergy, Asthma and Immunology suggests the following steps to reduce your exposure to pollen during allergy seasons. 1. Do not hang sheets or clothing out to dry; pollen may collect on these items. 2. Do not mow lawns or spend time around freshly cut grass; mowing stirs up pollen. 3. Keep windows closed at night.  Keep car windows closed while driving. 4. Minimize morning activities outdoors, a time when pollen counts are usually at their highest. 5. Stay indoors as much as possible when pollen counts or humidity is high and on windy days when pollen tends to remain in the air longer. 6. Use air conditioning when possible.  Many air conditioners have filters that trap the pollen spores. 7. Use a HEPA room air filter to remove pollen form the indoor air you  breathe.   Control of Mold Allergen Mold and fungi can grow on a variety of surfaces provided certain temperature and moisture conditions exist.  Outdoor molds grow on plants, decaying vegetation and soil.  The major outdoor mold, Alternaria and Cladosporium, are found in very high numbers during hot and dry conditions.  Generally, a late Summer - Fall peak is seen for common outdoor fungal spores.  Rain will temporarily lower outdoor mold spore count, but counts rise rapidly when the rainy period ends.  The most important indoor molds are Aspergillus and Penicillium.  Dark, humid and poorly ventilated basements are ideal sites for mold growth.  The next most common sites of mold growth are the bathroom and the kitchen.  Outdoor [employer] 8. Use air conditioning and keep windows closed 9. Avoid exposure to decaying vegetation. 10. Avoid leaf raking. 11. Avoid grain handling. 12. Consider wearing a face mask if working in moldy areas.  Indoor Mold Control 1. Maintain humidity below 50%. 2. Clean washable surfaces with 5% bleach solution. 3. Remove sources e.g. Contaminated carpets.   Control of Dust Mite Allergen Dust mites play a major role in allergic asthma and rhinitis. They occur in environments with high humidity wherever human skin is found. Dust mites absorb humidity from the atmosphere (ie, they do not drink) and feed on organic matter (including shed human and animal skin). Dust mites are a microscopic type of insect that you cannot see with the naked eye. High levels of dust mites have been detected  from mattresses, pillows, carpets, upholstered furniture, bed covers, clothes, soft toys and any woven material. The principal allergen of the dust mite is found in its feces. A gram of dust may contain 1,000 mites and 250,000 fecal particles. Mite antigen is easily measured in the air during house cleaning activities. Dust mites do not bite and do not cause harm to humans, other than by  triggering allergies/asthma.  Ways to decrease your exposure to dust mites in your home:  1. Encase mattresses, box springs and pillows with a mite-impermeable barrier or cover  2. Wash sheets, blankets and drapes weekly in hot water (130 F) with detergent and dry them in a dryer on the hot setting.  3. Have the room cleaned frequently with a vacuum cleaner and a damp dust-mop. For carpeting or rugs, vacuuming with a vacuum cleaner equipped with a high-efficiency particulate air (HEPA) filter. The dust mite allergic individual should not be in a room which is being cleaned and should wait 1 hour after cleaning before going into the room.  4. Do not sleep on upholstered furniture (eg, couches).  5. If possible removing carpeting, upholstered furniture and drapery from the home is ideal. Horizontal blinds should be eliminated in the rooms where the person spends the most time (bedroom, study, television room). Washable vinyl, roller-type shades are optimal.  6. Remove all non-washable stuffed toys from the bedroom. Wash stuffed toys weekly like sheets and blankets above.  7. Reduce indoor humidity to less than 50%. Inexpensive humidity monitors can be purchased at most hardware stores. Do not use a humidifier as can make the problem worse and are not recommended.

## 2019-12-24 ENCOUNTER — Encounter: Payer: Self-pay | Admitting: *Deleted

## 2020-01-02 ENCOUNTER — Other Ambulatory Visit: Payer: Self-pay

## 2020-01-02 ENCOUNTER — Encounter: Payer: Self-pay | Admitting: Physician Assistant

## 2020-01-02 ENCOUNTER — Ambulatory Visit (INDEPENDENT_AMBULATORY_CARE_PROVIDER_SITE_OTHER): Payer: 59 | Admitting: Physician Assistant

## 2020-01-02 DIAGNOSIS — L821 Other seborrheic keratosis: Secondary | ICD-10-CM | POA: Diagnosis not present

## 2020-01-02 DIAGNOSIS — Z1283 Encounter for screening for malignant neoplasm of skin: Secondary | ICD-10-CM | POA: Diagnosis not present

## 2020-01-02 DIAGNOSIS — M67472 Ganglion, left ankle and foot: Secondary | ICD-10-CM | POA: Diagnosis not present

## 2020-01-02 DIAGNOSIS — D171 Benign lipomatous neoplasm of skin and subcutaneous tissue of trunk: Secondary | ICD-10-CM | POA: Diagnosis not present

## 2020-01-02 DIAGNOSIS — M67449 Ganglion, unspecified hand: Secondary | ICD-10-CM

## 2020-01-02 NOTE — Progress Notes (Signed)
   Follow-Up Visit   Subjective  Christian Carroll is a 44 y.o. male who presents for the following: Annual Exam (back - ? cyst, left foot 2nd toes ? cyst as well).   The following portions of the chart were reviewed this encounter and updated as appropriate:     Objective  Well appearing patient in no apparent distress; mood and affect are within normal limits.  A full examination was performed including scalp, head, eyes, ears, nose, lips, neck, chest, axillae, abdomen, back, buttocks, bilateral upper extremities, bilateral lower extremities, hands, feet, fingers, toes, fingernails, and toenails. All findings within normal limits unless otherwise noted below.  Objective  Right Temporal Scalp: Stuck-on, waxy, tan-brown papules and plaques. --Discussed benign etiology and prognosis.   Objective  head to toe: No atypical nevi No signs of non-mole skin cancer.   Objective  Left 3rd Distal Interphalangeal Joint of Toe: Solitary, smooth skin colored to translucent papule.   Assessment & Plan  Lipoma of torso Left Lower Back  Seborrheic keratosis Right Temporal Scalp  observe  Screening exam for skin cancer head to toe  Digital mucous cyst Left 3rd Distal Interphalangeal Joint of Toe  observe   I, Cereniti Curb, PA-C, have reviewed all documentation's for this visit.  The documentation on 01/02/20 for the exam, diagnosis, procedures and orders are all accurate and complete.

## 2020-03-03 ENCOUNTER — Telehealth: Payer: Self-pay | Admitting: Family Medicine

## 2020-03-03 MED ORDER — FLUNISOLIDE 25 MCG/ACT (0.025%) NA SOLN: NASAL | 5 refills | Status: DC

## 2020-03-03 NOTE — Telephone Encounter (Signed)
PT called asking for flunisolide 92mcg need to be sent to [facility] on [address]. [facility]. Please call pt when sent in.

## 2020-03-03 NOTE — Telephone Encounter (Signed)
Refill on flunisolide x 1 with 5 refills at [facility]. Patient informed.

## 2020-05-06 ENCOUNTER — Ambulatory Visit: Payer: 59 | Attending: Internal Medicine

## 2020-05-06 DIAGNOSIS — Z23 Encounter for immunization: Secondary | ICD-10-CM

## 2020-05-06 NOTE — Progress Notes (Signed)
° °  Covid-19 Vaccination Clinic  Name:  Christian Carroll    MRN: [id number] DOB: [birth year]  05/06/2020  Mr. Christian Carroll was observed post Covid-19 immunization for 15 minutes without incident. He was provided with Vaccine Information Sheet and instruction to access the V-Safe system.   Christian Carroll was instructed to call 911 with any severe reactions post vaccine:  Difficulty breathing   Swelling of face and throat   A fast heartbeat   A bad rash all over body   Dizziness and weakness   Immunizations Administered    Name Date Dose VIS Date Route   Pfizer COVID-19 Vaccine 05/06/2020  1:47 PM 0.3 mL 04/07/2020 Intramuscular   Manufacturer: [place]   Lot: [id number]   [place]: [id number]

## 2020-05-24 ENCOUNTER — Other Ambulatory Visit: Payer: 59

## 2020-05-24 DIAGNOSIS — Z20822 Contact with and (suspected) exposure to covid-19: Secondary | ICD-10-CM

## 2020-05-26 LAB — NOVEL CORONAVIRUS, NAA: SARS-CoV-2, NAA: NOT DETECTED

## 2020-05-26 LAB — SARS-COV-2, NAA 2 DAY TAT

## 2020-05-31 ENCOUNTER — Other Ambulatory Visit: Payer: Self-pay | Admitting: Family

## 2020-05-31 NOTE — Patient Instructions (Addendum)
Asthma Continue Flovent 110- 2 puffs once a day to prevent cough or wheeze Continue montelukast 10 mg once a day to help prevent cough and wheeze Continue albuterol 2 puffs every 4 hours as needed for coughing, wheezing, tightness in chest or shortness of breath.  Also may use albuterol 2 puffs 5 to 15 minutes prior to exercise. For asthma flare, begin Flovent 110-2 puffs twice a day with a spacer for 2 weeks or until coguh and wheeze free  Allergic rhinitis Begin Xhance 2 sprays in each nostril twice a day for nasal congestion. This will replace flunisolide nasal spray Continue Xyzal 5 mg take 1 tablet once a day as needed for runny nose and itching. Continue saline nasal rinses as needed for nasal symptoms. Use this before any medicated nasal sprays for best result Continue allergen avoidance measures directed toward pollens, mold, and dust mites as listed below  Allergic conjunctivitis Continue Opcon-A using 1 drop 3 times a day as needed for itchy watery eyes. Some over the counter eye drops include Pataday one drop in each eye once a day as needed for red, itchy eyes OR Zaditor one drop in each eye twice a day as needed for red itchy eyes.  Reflux Continue dietary and lifestyle modifications as listed below Continue omeprazole 20 mg once a day as needed for reflux  Please let us know if this treatment plan is not working well for you.  Follow up in 6 months or sooner if needed.  Reducing Pollen Exposure The American Academy of Allergy, Asthma and Immunology suggests the following steps to reduce your exposure to pollen during allergy seasons. 1. Do not hang sheets or clothing out to dry; pollen may collect on these items. 2. Do not mow lawns or spend time around freshly cut grass; mowing stirs up pollen. 3. Keep windows closed at night.  Keep car windows closed while driving. 4. Minimize morning activities outdoors, a time when pollen counts are usually at their highest. 5. Stay  indoors as much as possible when pollen counts or humidity is high and on windy days when pollen tends to remain in the air longer. 6. Use air conditioning when possible.  Many air conditioners have filters that trap the pollen spores. 7. Use a HEPA room air filter to remove pollen form the indoor air you breathe.   Control of Mold Allergen Mold and fungi can grow on a variety of surfaces provided certain temperature and moisture conditions exist.  Outdoor molds grow on plants, decaying vegetation and soil.  The major outdoor mold, Alternaria and Cladosporium, are found in very high numbers during hot and dry conditions.  Generally, a late Summer - Fall peak is seen for common outdoor fungal spores.  Rain will temporarily lower outdoor mold spore count, but counts rise rapidly when the rainy period ends.  The most important indoor molds are Aspergillus and Penicillium.  Dark, humid and poorly ventilated basements are ideal sites for mold growth.  The next most common sites of mold growth are the bathroom and the kitchen.  Outdoor [employer] 8. Use air conditioning and keep windows closed 9. Avoid exposure to decaying vegetation. 10. Avoid leaf raking. 11. Avoid grain handling. 12. Consider wearing a face mask if working in moldy areas.  Indoor Mold Control 1. Maintain humidity below 50%. 2. Clean washable surfaces with 5% bleach solution. 3. Remove sources e.g. Contaminated carpets.   Control of Dust Mite Allergen Dust mites play a major role in allergic  asthma and rhinitis. They occur in environments with high humidity wherever human skin is found. Dust mites absorb humidity from the atmosphere (ie, they do not drink) and feed on organic matter (including shed human and animal skin). Dust mites are a microscopic type of insect that you cannot see with the naked eye. High levels of dust mites have been detected from mattresses, pillows, carpets, upholstered furniture, bed covers, clothes,  soft toys and any woven material. The principal allergen of the dust mite is found in its feces. A gram of dust may contain 1,000 mites and 250,000 fecal particles. Mite antigen is easily measured in the air during house cleaning activities. Dust mites do not bite and do not cause harm to humans, other than by triggering allergies/asthma.  Ways to decrease your exposure to dust mites in your home:  1. Encase mattresses, box springs and pillows with a mite-impermeable barrier or cover  2. Wash sheets, blankets and drapes weekly in hot water (130 F) with detergent and dry them in a dryer on the hot setting.  3. Have the room cleaned frequently with a vacuum cleaner and a damp dust-mop. For carpeting or rugs, vacuuming with a vacuum cleaner equipped with a high-efficiency particulate air (HEPA) filter. The dust mite allergic individual should not be in a room which is being cleaned and should wait 1 hour after cleaning before going into the room.  4. Do not sleep on upholstered furniture (eg, couches).  5. If possible removing carpeting, upholstered furniture and drapery from the home is ideal. Horizontal blinds should be eliminated in the rooms where the person spends the most time (bedroom, study, television room). Washable vinyl, roller-type shades are optimal.  6. Remove all non-washable stuffed toys from the bedroom. Wash stuffed toys weekly like sheets and blankets above.  7. Reduce indoor humidity to less than 50%. Inexpensive humidity monitors can be purchased at most hardware stores. Do not use a humidifier as can make the problem worse and are not recommended.

## 2020-05-31 NOTE — Telephone Encounter (Signed)
Ok to refill montelukast 10 mg once a day. Quantity 30 with 3 refills. Make sure that he he keeps his follow up appointment with Webb [facility]mith tomorrow.

## 2020-05-31 NOTE — Progress Notes (Addendum)
[address] [place] [address] Dept: [phone]  FOLLOW UP NOTE  Patient ID: Christian Carroll, male    DOB: 1975/12/09  Age: 44 y.o. MRN: [id number] Date of Office Visit: 06/01/2020  Assessment  Chief Complaint: Asthma  HPI Christian Carroll is a 44 year old male who presents to the clinic for a follow up visit. He was last seen in this clinic on 12/11/2019 for evaluation of asthma, allergic rhinitis, allergic conjunctivitis, and reflux. At today's visit, he reports his asthma has been moderately well controlled with shortness of breath and wheezing occurring with vigorous activity and cold weather.  He believes that this is caused by nasal drainage.  He denies cough with rest and activity.  He continues montelukast 10 mg once a day, Flovent 110-2 puffs once a day, and albuterol about 3-4 times a week before activity.  Allergic rhinitis is reported as moderately well controlled with symptoms including nasal congestion and postnasal drainage.  He denies rhinorrhea and sneezing.  He continues Xyzal 5 mg once a day, flunisolide daily, and saline nasal rinses as needed.  He does report that he had much better control of allergic rhinitis while using XHANCE.  Allergic conjunctivitis is reported as moderately well controlled with occasional dry and itchy eyes for which he uses Opcon-A with relief of symptoms.  He denies reflux and does not experience symptoms including heartburn or vomiting.  He continues omeprazole 20 mg as needed.  His current medications are listed in the chart.  Drug Allergies:  No Known Allergies  Physical Exam: BP 106/76 (BP Location: Left Arm, Patient Position: Sitting, Cuff Size: Normal)   Pulse 70   Temp 97.8 F (36.6 C) (Tympanic)   Resp 12   Ht 5\' 11"  (1.803 m)   Wt 167 lb 12.8 oz (76.1 kg)   SpO2 98%   BMI 23.40 kg/m    Physical Exam Vitals reviewed.  Constitutional:      Appearance: Normal appearance.  HENT:     Head: Normocephalic and atraumatic.      Right Ear: Tympanic membrane normal.     Left Ear: Tympanic membrane normal.     Nose:     Comments: Bilateral nares slightly edematous and pale with clear nasal drainage noted.  Pharynx slightly erythematous with no exudate.  Ears normal.  Eyes normal. Eyes:     Conjunctiva/sclera: Conjunctivae normal.  Cardiovascular:     Rate and Rhythm: Normal rate and regular rhythm.     Heart sounds: Normal heart sounds. No murmur heard.   Pulmonary:     Effort: Pulmonary effort is normal.     Breath sounds: Normal breath sounds.     Comments: Lungs clear to auscultation Musculoskeletal:        General: Normal range of motion.     Cervical back: Normal range of motion and neck supple.  Skin:    General: Skin is warm and dry.  Neurological:     Mental Status: He is alert and oriented to person, place, and time.  Psychiatric:        Mood and Affect: Mood normal.        Behavior: Behavior normal.        Thought Content: Thought content normal.        Judgment: Judgment normal.     Diagnostics: FVC 5.63, FEV1 4.39.  Predicted FVC 5.38.  Predicted FEV1 4.24.  Spirometry indicates normal ventilatory function.  Assessment and Plan: 1. Moderate persistent asthma without complication  2. Seasonal and perennial allergic rhinitis   3. Allergic conjunctivitis of both eyes   4. Gastroesophageal reflux disease, unspecified whether esophagitis present     Meds ordered this encounter  Medications  . montelukast (SINGULAIR) 10 MG tablet    Sig: Take one tablet once daily at night for coughing or wheezing.    Dispense:  90 tablet    Refill:  1    Dispense 90 day supply.  . Fluticasone Propionate (XHANCE) 93 MCG/ACT EXHU    Sig: 2 puffs twice daily as needed for stuffy nose.    Dispense:  48 mL    Refill:  1    Patient Instructions  Asthma Continue Flovent 110- 2 puffs once a day to prevent cough or wheeze Continue montelukast 10 mg once a day to help prevent cough and  wheeze Continue albuterol 2 puffs every 4 hours as needed for coughing, wheezing, tightness in chest or shortness of breath.  Also may use albuterol 2 puffs 5 to 15 minutes prior to exercise. For asthma flare, begin Flovent 110-2 puffs twice a day with a spacer for 2 weeks or until coguh and wheeze free  Allergic rhinitis Begin Xhance 2 sprays in each nostril twice a day for nasal congestion. This will replace flunisolide nasal spray Continue Xyzal 5 mg take 1 tablet once a day as needed for runny nose and itching. Continue saline nasal rinses as needed for nasal symptoms. Use this before any medicated nasal sprays for best result Continue allergen avoidance measures directed toward pollens, mold, and dust mites as listed below  Allergic conjunctivitis Continue Opcon-A using 1 drop 3 times a day as needed for itchy watery eyes. Some over the counter eye drops include Pataday one drop in each eye once a day as needed for red, itchy eyes OR Zaditor one drop in each eye twice a day as needed for red itchy eyes.  Reflux Continue dietary and lifestyle modifications as listed below Continue omeprazole 20 mg once a day as needed for reflux  Please let us know if this treatment plan is not working well for you.  Follow up in 6 months or sooner if needed.   Return in about 6 months (around 11/30/2020), or if symptoms worsen or fail to improve.    Thank you for the opportunity to care for this patient.  Please do not hesitate to contact me with questions.  Gareth Morgan, FNP [facility]  ________________________________________________  I have provided oversight concerning [facility]'s evaluation and treatment of this patient's health issues addressed during today's encounter.  I agree with the assessment and therapeutic plan as outlined in the note.   Signed,   R Edgar Frisk, MD

## 2020-06-01 ENCOUNTER — Other Ambulatory Visit: Payer: Self-pay

## 2020-06-01 ENCOUNTER — Ambulatory Visit: Payer: 59 | Admitting: Family Medicine

## 2020-06-01 ENCOUNTER — Encounter: Payer: Self-pay | Admitting: Family Medicine

## 2020-06-01 VITALS — BP 106/76 | HR 70 | Temp 97.8°F | Resp 12 | Ht 71.0 in | Wt 167.8 lb

## 2020-06-01 DIAGNOSIS — J302 Other seasonal allergic rhinitis: Secondary | ICD-10-CM

## 2020-06-01 DIAGNOSIS — J4540 Moderate persistent asthma, uncomplicated: Secondary | ICD-10-CM | POA: Diagnosis not present

## 2020-06-01 DIAGNOSIS — J3089 Other allergic rhinitis: Secondary | ICD-10-CM | POA: Diagnosis not present

## 2020-06-01 DIAGNOSIS — K219 Gastro-esophageal reflux disease without esophagitis: Secondary | ICD-10-CM | POA: Diagnosis not present

## 2020-06-01 DIAGNOSIS — H1013 Acute atopic conjunctivitis, bilateral: Secondary | ICD-10-CM

## 2020-06-01 MED ORDER — MONTELUKAST SODIUM 10 MG PO TABS: ORAL_TABLET | ORAL | 1 refills | Status: DC

## 2020-06-01 MED ORDER — XHANCE 93 MCG/ACT NA EXHU: INHALANT_SUSPENSION | NASAL | 1 refills | Status: DC

## 2020-06-08 ENCOUNTER — Other Ambulatory Visit: Payer: 59

## 2020-06-08 DIAGNOSIS — Z20822 Contact with and (suspected) exposure to covid-19: Secondary | ICD-10-CM

## 2020-06-09 LAB — SARS-COV-2, NAA 2 DAY TAT

## 2020-06-09 LAB — NOVEL CORONAVIRUS, NAA: SARS-CoV-2, NAA: NOT DETECTED

## 2020-07-20 ENCOUNTER — Other Ambulatory Visit: Payer: Self-pay | Admitting: Family

## 2020-09-07 ENCOUNTER — Other Ambulatory Visit: Payer: Self-pay | Admitting: *Deleted

## 2020-09-07 MED ORDER — XHANCE 93 MCG/ACT NA EXHU: INHALANT_SUSPENSION | NASAL | 1 refills | Status: DC

## 2020-10-27 NOTE — Progress Notes (Signed)
[place] [address] Dept: [phone]  FOLLOW UP NOTE  Patient ID: Christian Carroll, male    DOB: May 04, 1976  Age: 45 y.o. MRN: [id number] Date of Office Visit: 10/28/2020  Assessment  Chief Complaint: Allergic Rhinitis  (Cough, sneezing, ) and Asthma (Wheezing mostly in the morning on/off throughout the week - triggers pollen, grass, dogs )  HPI Christian Carroll is a 45 year old male who presents to the clinic for a follow up visit. He was last seen in this clinic on 06/01/2020 for evaluation of asthma, allergic rhinitis, allergic conjunctivitis, and reflux.  At today's visit, he reports his asthma has been moderately well controlled with no shortness of breath or cough with activity or rest.  He does report wheezing occurring about 2 to 3 days a week in the morning only.  He continues montelukast 10 mg once a day, Flovent 110-2 puffs in the morning, and albuterol 1 or 2 puffs before activity about 3 to 4 days a week.  He does report that wheeze resolves if he uses albuterol.  Reflux is reported as well controlled with no heartburn or vomiting.  He is not taking omeprazole at this time.  Allergic rhinitis is reported as moderately well controlled with symptoms including nasal congestion especially in the springtime and postnasal drainage.  He continues Xhance daily, Xyzal 5 mg once a day, and uses saline nasal rinse as needed.  He reports saline nasal rinse has been very effective for clearing postnasal drainage and nasal congestion.  Allergic conjunctivitis is reported as well controlled with occasional red and itchy eyes, especially after mowing the lawn, for which he uses olopatadine with relief of symptoms.  His current medications are listed in the chart.   Drug Allergies:  No Known Allergies  Physical Exam: BP 130/80   Pulse 65   Temp 98.1 F (36.7 C)   Resp 18   Ht 5\' 11"  (1.803 m)   Wt 172 lb (78 kg)   SpO2 98%   BMI 23.99 kg/m    Physical Exam Vitals  reviewed.  Constitutional:      Appearance: Normal appearance.  HENT:     Head: Normocephalic and atraumatic.     Right Ear: Tympanic membrane normal.     Left Ear: Tympanic membrane normal.     Nose:     Comments: Bilateral nares normal.  Pharynx normal.  Ears normal.  Eyes normal.    Mouth/Throat:     Pharynx: Oropharynx is clear.  Eyes:     Conjunctiva/sclera: Conjunctivae normal.  Cardiovascular:     Rate and Rhythm: Normal rate and regular rhythm.     Heart sounds: Normal heart sounds. No murmur heard.   Pulmonary:     Effort: Pulmonary effort is normal.     Breath sounds: Normal breath sounds.     Comments: Lungs clear to auscultation Musculoskeletal:        General: Normal range of motion.     Cervical back: Normal range of motion and neck supple.  Skin:    General: Skin is warm and dry.  Neurological:     Mental Status: He is alert and oriented to person, place, and time.  Psychiatric:        Mood and Affect: Mood normal.        Behavior: Behavior normal.        Thought Content: Thought content normal.        Judgment: Judgment normal.  Diagnostics: FVC 5.11, FEV1 3.77.  Predicted FVC 5.15, predicted FEV1 4.10.  Spirometry indicates normal ventilatory function.  Assessment and Plan: 1. Moderate persistent asthma without complication   2. Seasonal and perennial allergic rhinitis   3. Allergic conjunctivitis of both eyes   4. Gastroesophageal reflux disease, unspecified whether esophagitis present     Meds ordered this encounter  Medications  . XHANCE 93 MCG/ACT EXHU    Sig: 2 sprays in each nostril twice a day for nasal congestion    Dispense:  48 mL    Refill:  2  . levocetirizine (XYZAL) 5 MG tablet    Sig: Take 1 tablet once a day as needed for runny nose and itching    Dispense:  30 tablet    Refill:  5  . montelukast (SINGULAIR) 10 MG tablet    Sig: Take one tablet once a day to help prevent cough and wheeze    Dispense:  30 tablet    Refill:   5    Dispense 90 day supply.  Marland Kitchen albuterol (PROAIR HFA) 108 (90 Base) MCG/ACT inhaler    Sig: Inhale 2 puffs into the lungs every 4 (four) hours as needed for wheezing or shortness of breath.    Dispense:  18 g    Refill:  1  . fluticasone (FLOVENT HFA) 110 MCG/ACT inhaler    Sig: Continue Flovent 110-2 puffs once a day with a spacer to prevent cough or wheeze.  For asthma flare, increase Flovent 110 to 2 puffs twice a day for 2 weeks or until cough and wheeze free    Dispense:  1 each    Refill:  5    Patient Instructions  Asthma Continue Flovent 110- 2 puffs once a day to prevent cough or wheeze Continue montelukast 10 mg once a day to help prevent cough and wheeze Continue albuterol 2 puffs every 4 hours as needed for coughing, wheezing, tightness in chest or shortness of breath.  Also may use albuterol 2 puffs 5 to 15 minutes prior to exercise. For now and for asthma flare, begin Flovent 110-2 puffs twice a day with a spacer for 2 weeks or until coguh and wheeze free  Allergic rhinitis Continue Xhance 2 sprays in each nostril twice a day for nasal congestion. This will replace flunisolide nasal spray Continue Xyzal 5 mg take 1 tablet once a day as needed for runny nose and itching. Continue saline nasal rinses as needed for nasal symptoms. Use this before any medicated nasal sprays for best result Continue allergen avoidance measures directed toward pollens, mold, and dust mites as listed below  Allergic conjunctivitis Continue Opcon-A using 1 drop 3 times a day as needed for itchy watery eyes. Some over the counter eye drops include Pataday one drop in each eye once a day as needed for red, itchy eyes OR Zaditor one drop in each eye twice a day as needed for red itchy eyes.  Reflux Continue dietary and lifestyle modifications as listed below Continue omeprazole 20 mg once a day as needed for reflux  Please let us know if this treatment plan is not working well for you.  Follow  up in 6 months or sooner if needed.   Return in about 6 months (around 04/30/2021), or if symptoms worsen or fail to improve.    Thank you for the opportunity to care for this patient.  Please do not hesitate to contact me with questions.  Gareth Morgan, FNP [facility] [place]

## 2020-10-27 NOTE — Patient Instructions (Addendum)
Asthma Continue Flovent 110- 2 puffs once a day to prevent cough or wheeze Continue montelukast 10 mg once a day to help prevent cough and wheeze Continue albuterol 2 puffs every 4 hours as needed for coughing, wheezing, tightness in chest or shortness of breath.  Also may use albuterol 2 puffs 5 to 15 minutes prior to exercise. For asthma flare, begin Flovent 110-2 puffs twice a day with a spacer for 2 weeks or until coguh and wheeze free  Allergic rhinitis Continue Xhance 2 sprays in each nostril twice a day for nasal congestion. This will replace flunisolide nasal spray Continue Xyzal 5 mg take 1 tablet once a day as needed for runny nose and itching. Continue saline nasal rinses as needed for nasal symptoms. Use this before any medicated nasal sprays for best result Continue allergen avoidance measures directed toward pollens, mold, and dust mites as listed below  Allergic conjunctivitis Continue Opcon-A using 1 drop 3 times a day as needed for itchy watery eyes. Some over the counter eye drops include Pataday one drop in each eye once a day as needed for red, itchy eyes OR Zaditor one drop in each eye twice a day as needed for red itchy eyes.  Reflux Continue dietary and lifestyle modifications as listed below Continue omeprazole 20 mg once a day as needed for reflux  Please let us know if this treatment plan is not working well for you.  Follow up in 6 months or sooner if needed.  Reducing Pollen Exposure The American Academy of Allergy, Asthma and Immunology suggests the following steps to reduce your exposure to pollen during allergy seasons. 1. Do not hang sheets or clothing out to dry; pollen may collect on these items. 2. Do not mow lawns or spend time around freshly cut grass; mowing stirs up pollen. 3. Keep windows closed at night.  Keep car windows closed while driving. 4. Minimize morning activities outdoors, a time when pollen counts are usually at their highest. 5. Stay  indoors as much as possible when pollen counts or humidity is high and on windy days when pollen tends to remain in the air longer. 6. Use air conditioning when possible.  Many air conditioners have filters that trap the pollen spores. 7. Use a HEPA room air filter to remove pollen form the indoor air you breathe.   Control of Mold Allergen Mold and fungi can grow on a variety of surfaces provided certain temperature and moisture conditions exist.  Outdoor molds grow on plants, decaying vegetation and soil.  The major outdoor mold, Alternaria and Cladosporium, are found in very high numbers during hot and dry conditions.  Generally, a late Summer - Fall peak is seen for common outdoor fungal spores.  Rain will temporarily lower outdoor mold spore count, but counts rise rapidly when the rainy period ends.  The most important indoor molds are Aspergillus and Penicillium.  Dark, humid and poorly ventilated basements are ideal sites for mold growth.  The next most common sites of mold growth are the bathroom and the kitchen.  Outdoor [employer] 8. Use air conditioning and keep windows closed 9. Avoid exposure to decaying vegetation. 10. Avoid leaf raking. 11. Avoid grain handling. 12. Consider wearing a face mask if working in moldy areas.  Indoor Mold Control 1. Maintain humidity below 50%. 2. Clean washable surfaces with 5% bleach solution. 3. Remove sources e.g. Contaminated carpets.   Control of Dust Mite Allergen Dust mites play a major role in allergic  asthma and rhinitis. They occur in environments with high humidity wherever human skin is found. Dust mites absorb humidity from the atmosphere (ie, they do not drink) and feed on organic matter (including shed human and animal skin). Dust mites are a microscopic type of insect that you cannot see with the naked eye. High levels of dust mites have been detected from mattresses, pillows, carpets, upholstered furniture, bed covers, clothes,  soft toys and any woven material. The principal allergen of the dust mite is found in its feces. A gram of dust may contain 1,000 mites and 250,000 fecal particles. Mite antigen is easily measured in the air during house cleaning activities. Dust mites do not bite and do not cause harm to humans, other than by triggering allergies/asthma.  Ways to decrease your exposure to dust mites in your home:  1. Encase mattresses, box springs and pillows with a mite-impermeable barrier or cover  2. Wash sheets, blankets and drapes weekly in hot water (130 F) with detergent and dry them in a dryer on the hot setting.  3. Have the room cleaned frequently with a vacuum cleaner and a damp dust-mop. For carpeting or rugs, vacuuming with a vacuum cleaner equipped with a high-efficiency particulate air (HEPA) filter. The dust mite allergic individual should not be in a room which is being cleaned and should wait 1 hour after cleaning before going into the room.  4. Do not sleep on upholstered furniture (eg, couches).  5. If possible removing carpeting, upholstered furniture and drapery from the home is ideal. Horizontal blinds should be eliminated in the rooms where the person spends the most time (bedroom, study, television room). Washable vinyl, roller-type shades are optimal.  6. Remove all non-washable stuffed toys from the bedroom. Wash stuffed toys weekly like sheets and blankets above.  7. Reduce indoor humidity to less than 50%. Inexpensive humidity monitors can be purchased at most hardware stores. Do not use a humidifier as can make the problem worse and are not recommended.

## 2020-10-28 ENCOUNTER — Encounter: Payer: Self-pay | Admitting: Family Medicine

## 2020-10-28 ENCOUNTER — Other Ambulatory Visit: Payer: Self-pay

## 2020-10-28 ENCOUNTER — Ambulatory Visit: Payer: 59 | Admitting: Family Medicine

## 2020-10-28 VITALS — BP 130/80 | HR 65 | Temp 98.1°F | Resp 18 | Ht 71.0 in | Wt 172.0 lb

## 2020-10-28 DIAGNOSIS — H1013 Acute atopic conjunctivitis, bilateral: Secondary | ICD-10-CM

## 2020-10-28 DIAGNOSIS — J3089 Other allergic rhinitis: Secondary | ICD-10-CM | POA: Diagnosis not present

## 2020-10-28 DIAGNOSIS — K219 Gastro-esophageal reflux disease without esophagitis: Secondary | ICD-10-CM

## 2020-10-28 DIAGNOSIS — J4540 Moderate persistent asthma, uncomplicated: Secondary | ICD-10-CM | POA: Diagnosis not present

## 2020-10-28 DIAGNOSIS — J302 Other seasonal allergic rhinitis: Secondary | ICD-10-CM

## 2020-10-28 MED ORDER — LEVOCETIRIZINE DIHYDROCHLORIDE 5 MG PO TABS: ORAL_TABLET | ORAL | 5 refills | Status: DC

## 2020-10-28 MED ORDER — MONTELUKAST SODIUM 10 MG PO TABS: ORAL_TABLET | ORAL | 5 refills | Status: DC

## 2020-10-28 MED ORDER — ALBUTEROL SULFATE HFA 108 (90 BASE) MCG/ACT IN AERS: 2.0000 | INHALATION_SPRAY | RESPIRATORY_TRACT | 1 refills | Status: DC | PRN

## 2020-10-28 MED ORDER — FLOVENT HFA 110 MCG/ACT IN AERO: INHALATION_SPRAY | RESPIRATORY_TRACT | 5 refills | Status: DC

## 2020-10-28 MED ORDER — XHANCE 93 MCG/ACT NA EXHU: INHALANT_SUSPENSION | NASAL | 2 refills | Status: DC

## 2020-11-03 ENCOUNTER — Ambulatory Visit: Payer: 59 | Admitting: Family Medicine

## 2021-05-19 NOTE — Patient Instructions (Addendum)
Asthma Continue Flovent 110- 2 puffs once a day to prevent cough or wheeze Continue montelukast 10 mg once a day to help prevent cough and wheeze Continue albuterol 2 puffs every 4 hours as needed for coughing, wheezing, tightness in chest or shortness of breath.  Also may use albuterol 2 puffs 5 to 15 minutes prior to exercise. For asthma flare, increase Flovent 110 to 2 puffs twice a day with a spacer for 2 weeks or until cough and wheeze free, then return to original dosing Written information provided regarding [facility]  Allergic rhinitis Try to reduce Xhance to 1 spray in each nostril twice a day for nasal congestion. Continue to follow up with your eye doctor Continue Xyzal 5 mg take 1 tablet once a day as needed for runny nose and itching. Continue saline nasal rinses as needed for nasal symptoms. Use this before any medicated nasal sprays for best result Continue allergen avoidance measures directed toward pollens, mold, and dust mites as listed below  Allergic conjunctivitis Continue Opcon-A using 1 drop 3 times a day as needed for itchy watery eyes. Some other over the counter eye drops include Pataday one drop in each eye once a day as needed for red, itchy eyes OR Zaditor one drop in each eye twice a day as needed for red itchy eyes.  Reflux Continue dietary and lifestyle modifications as listed below Begin omeprazole 20 mg once a day to control reflux  Please let us know if this treatment plan is not working well for you.  Follow up in 6 months or sooner if needed.  Reducing Pollen Exposure The American Academy of Allergy, Asthma and Immunology suggests the following steps to reduce your exposure to pollen during allergy seasons. Do not hang sheets or clothing out to dry; pollen may collect on these items. Do not mow lawns or spend time around freshly cut grass; mowing stirs up pollen. Keep windows closed at night.  Keep car windows closed while driving. Minimize morning  activities outdoors, a time when pollen counts are usually at their highest. Stay indoors as much as possible when pollen counts or humidity is high and on windy days when pollen tends to remain in the air longer. Use air conditioning when possible.  Many air conditioners have filters that trap the pollen spores. Use a HEPA room air filter to remove pollen form the indoor air you breathe.   Control of Mold Allergen Mold and fungi can grow on a variety of surfaces provided certain temperature and moisture conditions exist.  Outdoor molds grow on plants, decaying vegetation and soil.  The major outdoor mold, Alternaria and Cladosporium, are found in very high numbers during hot and dry conditions.  Generally, a late Summer - Fall peak is seen for common outdoor fungal spores.  Rain will temporarily lower outdoor mold spore count, but counts rise rapidly when the rainy period ends.  The most important indoor molds are Aspergillus and Penicillium.  Dark, humid and poorly ventilated basements are ideal sites for mold growth.  The next most common sites of mold growth are the bathroom and the kitchen.  Outdoor [employer] Use air conditioning and keep windows closed Avoid exposure to decaying vegetation. Avoid leaf raking. Avoid grain handling. Consider wearing a face mask if working in moldy areas.  Indoor Mold Control Maintain humidity below 50%. Clean washable surfaces with 5% bleach solution. Remove sources e.g. Contaminated carpets.   Control of Dust Mite Allergen Dust mites play a major role  in allergic asthma and rhinitis. They occur in environments with high humidity wherever human skin is found. Dust mites absorb humidity from the atmosphere (ie, they do not drink) and feed on organic matter (including shed human and animal skin). Dust mites are a microscopic type of insect that you cannot see with the naked eye. High levels of dust mites have been detected from mattresses, pillows,  carpets, upholstered furniture, bed covers, clothes, soft toys and any woven material. The principal allergen of the dust mite is found in its feces. A gram of dust may contain 1,000 mites and 250,000 fecal particles. Mite antigen is easily measured in the air during house cleaning activities. Dust mites do not bite and do not cause harm to humans, other than by triggering allergies/asthma.  Ways to decrease your exposure to dust mites in your home:  1. Encase mattresses, box springs and pillows with a mite-impermeable barrier or cover  2. Wash sheets, blankets and drapes weekly in hot water (130 F) with detergent and dry them in a dryer on the hot setting.  3. Have the room cleaned frequently with a vacuum cleaner and a damp dust-mop. For carpeting or rugs, vacuuming with a vacuum cleaner equipped with a high-efficiency particulate air (HEPA) filter. The dust mite allergic individual should not be in a room which is being cleaned and should wait 1 hour after cleaning before going into the room.  4. Do not sleep on upholstered furniture (eg, couches).  5. If possible removing carpeting, upholstered furniture and drapery from the home is ideal. Horizontal blinds should be eliminated in the rooms where the person spends the most time (bedroom, study, television room). Washable vinyl, roller-type shades are optimal.  6. Remove all non-washable stuffed toys from the bedroom. Wash stuffed toys weekly like sheets and blankets above.  7. Reduce indoor humidity to less than 50%. Inexpensive humidity monitors can be purchased at most hardware stores. Do not use a humidifier as can make the problem worse and are not recommended.

## 2021-05-19 NOTE — Progress Notes (Signed)
[place] [address] Dept: [phone]  FOLLOW UP NOTE  Patient ID: Christian Carroll, male    DOB: Oct 15, 1975  Age: 45 y.o. MRN: [id number] Date of Office Visit: 05/20/2021  Assessment  Chief Complaint: Asthma  HPI Christian Carroll is a 45 year old male who presents the clinic for follow-up visit.  He was last seen in this clinic on 10/28/2020 by Gareth Morgan, FNP, for evaluation of asthma, allergic rhinitis, allergic conjunctivitis, and reflux.  At today's visit, he reports his asthma has been moderately well controlled with occasional wheeze from postnasal drainage occurring mostly in the evening before bed as well as occasional dry cough.  He denies any coughing at nighttime.  He continues montelukast 10 mg once a day and decreased Flovent 110 to 2 puffs once a day in September.  He continues to use of albuterol 2 puffs before activity as well as 2-4 times a week for wheeze.  Of note, he has lab testing from 06/24/2020 reveals absolute eosinophil count of 0.2.  Allergic rhinitis is reported as moderately well controlled with symptoms including daily nasal congestion and postnasal drainage.  He continues Xhance 2 sprays in each nostril twice a day, Xyzal 5 mg once a day, occasional nasal saline rinses and occasional Zyrtec-D.  He has recently been for an eye exam where his ophthalmologist reported that he had increasing eye pressure which they will continue to monitor.  He has a follow-up appointment with his ophthalmologist in 6 months.  His last environmental allergy skin testing was on 05/06/2019 and was positive to pollens, mold, and dust mites.  Allergic conjunctivitis is reported as moderately well controlled with over-the-counter allergy eyedrops as needed.  Reflux is reported as moderately well controlled with symptoms including burping which is reported as occurring in cycles for about 1 week at a time.  He occasionally takes omeprazole as needed.  His current medications  are listed in the chart.   Drug Allergies:  No Known Allergies  Physical Exam: BP 126/80   Pulse 74   Temp 98.2 F (36.8 C) (Temporal)   Resp 17   SpO2 98%    Physical Exam Vitals reviewed.  Constitutional:      Appearance: Normal appearance.  HENT:     Head: Normocephalic and atraumatic.     Right Ear: Tympanic membrane normal.     Left Ear: Tympanic membrane normal.     Nose:     Comments: Bilateral nares slightly erythematous with clear nasal drainage noted.  Pharynx erythematous with no exudate.  Ears normal.  Eyes normal.    Mouth/Throat:     Pharynx: Oropharynx is clear.  Eyes:     Conjunctiva/sclera: Conjunctivae normal.  Cardiovascular:     Rate and Rhythm: Normal rate and regular rhythm.     Heart sounds: Normal heart sounds. No murmur heard. Pulmonary:     Effort: Pulmonary effort is normal.     Breath sounds: Normal breath sounds.     Comments: Lungs clear to auscultation Musculoskeletal:        General: Normal range of motion.     Cervical back: Normal range of motion and neck supple.  Skin:    General: Skin is warm and dry.  Neurological:     Mental Status: He is alert and oriented to person, place, and time.  Psychiatric:        Mood and Affect: Mood normal.        Behavior: Behavior normal.  Thought Content: Thought content normal.        Judgment: Judgment normal.    Diagnostics: FVC 5.61, FEV1 4.27.  Predicted FVC 5.22, predicted FEV1 4.11.  Spirometry indicates normal ventilatory function.  Assessment and Plan: 1. Moderate persistent asthma without complication   2. Seasonal and perennial allergic rhinitis   3. Allergic conjunctivitis of both eyes   4. Gastroesophageal reflux disease, unspecified whether esophagitis present     Meds ordered this encounter  Medications   fluticasone (FLOVENT HFA) 110 MCG/ACT inhaler    Sig: Continue Flovent 110-2 puffs once a day with a spacer to prevent cough or wheeze.  For asthma flare, increase  Flovent 110 to 2 puffs twice a day for 2 weeks or until cough and wheeze free    Dispense:  12 g    Refill:  5   levocetirizine (XYZAL) 5 MG tablet    Sig: Take 1 tablet once a day as needed for runny nose and itching    Dispense:  90 tablet    Refill:  1   montelukast (SINGULAIR) 10 MG tablet    Sig: Take one tablet once a day to help prevent cough and wheeze    Dispense:  90 tablet    Refill:  1   XHANCE 93 MCG/ACT EXHU    Sig: 2 sprays in each nostril twice a day for nasal congestion    Dispense:  48 mL    Refill:  2   albuterol (PROAIR HFA) 108 (90 Base) MCG/ACT inhaler    Sig: Inhale 2 puffs into the lungs every 4 (four) hours as needed for wheezing or shortness of breath.    Dispense:  18 g    Refill:  1     Patient Instructions  Asthma Continue Flovent 110- 2 puffs once a day to prevent cough or wheeze Continue montelukast 10 mg once a day to help prevent cough and wheeze Continue albuterol 2 puffs every 4 hours as needed for coughing, wheezing, tightness in chest or shortness of breath.  Also may use albuterol 2 puffs 5 to 15 minutes prior to exercise. For asthma flare, increase Flovent 110 to 2 puffs twice a day with a spacer for 2 weeks or until cough and wheeze free, then return to original dosing Written information provided regarding Fasenra  Allergic rhinitis Try to reduce Xhance to 1 spray in each nostril twice a day for nasal congestion. Continue to follow up with your eye doctor Continue Xyzal 5 mg take 1 tablet once a day as needed for runny nose and itching. Continue saline nasal rinses as needed for nasal symptoms. Use this before any medicated nasal sprays for best result Continue allergen avoidance measures directed toward pollens, mold, and dust mites as listed below  Allergic conjunctivitis Continue Opcon-A using 1 drop 3 times a day as needed for itchy watery eyes. Some other over the counter eye drops include Pataday one drop in each eye once a day as  needed for red, itchy eyes OR Zaditor one drop in each eye twice a day as needed for red itchy eyes.  Reflux Continue dietary and lifestyle modifications as listed below Begin omeprazole 20 mg once a day to control reflux  Please let us know if this treatment plan is not working well for you.  Follow up in 6 months or sooner if needed.   Return in about 6 months (around 11/18/2021), or if symptoms worsen or fail to improve.    Thank you  for the opportunity to care for this patient.  Please do not hesitate to contact me with questions.  Gareth Morgan, FNP [facility] [place]

## 2021-05-20 ENCOUNTER — Other Ambulatory Visit: Payer: Self-pay

## 2021-05-20 ENCOUNTER — Encounter: Payer: Self-pay | Admitting: Family Medicine

## 2021-05-20 ENCOUNTER — Ambulatory Visit: Payer: 59 | Admitting: Family Medicine

## 2021-05-20 VITALS — BP 126/80 | HR 74 | Temp 98.2°F | Resp 17

## 2021-05-20 DIAGNOSIS — J3089 Other allergic rhinitis: Secondary | ICD-10-CM | POA: Diagnosis not present

## 2021-05-20 DIAGNOSIS — J4540 Moderate persistent asthma, uncomplicated: Secondary | ICD-10-CM | POA: Diagnosis not present

## 2021-05-20 DIAGNOSIS — H1013 Acute atopic conjunctivitis, bilateral: Secondary | ICD-10-CM | POA: Diagnosis not present

## 2021-05-20 DIAGNOSIS — K219 Gastro-esophageal reflux disease without esophagitis: Secondary | ICD-10-CM

## 2021-05-20 DIAGNOSIS — J302 Other seasonal allergic rhinitis: Secondary | ICD-10-CM

## 2021-05-20 MED ORDER — FLUTICASONE PROPIONATE HFA 110 MCG/ACT IN AERO: INHALATION_SPRAY | RESPIRATORY_TRACT | 5 refills | Status: DC

## 2021-05-20 MED ORDER — XHANCE 93 MCG/ACT NA EXHU: INHALANT_SUSPENSION | NASAL | 2 refills | Status: DC

## 2021-05-20 MED ORDER — MONTELUKAST SODIUM 10 MG PO TABS: ORAL_TABLET | ORAL | 1 refills | Status: DC

## 2021-05-20 MED ORDER — ALBUTEROL SULFATE HFA 108 (90 BASE) MCG/ACT IN AERS: 2.0000 | INHALATION_SPRAY | RESPIRATORY_TRACT | 1 refills | Status: DC | PRN

## 2021-05-20 MED ORDER — LEVOCETIRIZINE DIHYDROCHLORIDE 5 MG PO TABS: ORAL_TABLET | ORAL | 1 refills | Status: DC

## 2021-11-22 NOTE — Progress Notes (Unsigned)
   [place] [address] Dept: [phone]  FOLLOW UP NOTE  Patient ID: Christian Carroll, male    DOB: [birth year]  Age: 46 y.o. MRN: [id number] Date of Office Visit: 11/23/2021  Assessment  Chief Complaint: No chief complaint on file.  HPI Christian Carroll is a 46 year old male who presents to the clinic for follow-up visit.  He was last seen in this clinic on 05/20/2021 by Gareth Morgan, FNP, for evaluation of asthma, allergic rhinitis, allergic conjunctivitis, and reflux. His last environmental allergy skin testing was on 05/06/2019 and was positive to pollens, mold, and dust mites.    Drug Allergies:  No Known Allergies  Physical Exam: There were no vitals taken for this visit.   Physical Exam  Diagnostics:    Assessment and Plan: No diagnosis found.  No orders of the defined types were placed in this encounter.   There are no Patient Instructions on file for this visit.  No follow-ups on file.    Thank you for the opportunity to care for this patient.  Please do not hesitate to contact me with questions.  Gareth Morgan, FNP [facility] [place]

## 2021-11-22 NOTE — Patient Instructions (Incomplete)
Asthma Continue Flovent 110- 2 puffs once a day to prevent cough or wheeze Continue montelukast 10 mg once a day to help prevent cough and wheeze Continue albuterol 2 puffs every 4 hours as needed for coughing, wheezing, tightness in chest or shortness of breath.  Also may use albuterol 2 puffs 5 to 15 minutes prior to exercise. For asthma flare, increase Flovent 110 to 2 puffs twice a day with a spacer for 2 weeks or until cough and wheeze free, then return to original dosing Written information provided regarding Fasenra  Allergic rhinitis Try to reduce Xhance to 1 spray in each nostril twice a day for nasal congestion. Continue to follow up with your eye doctor Continue Xyzal 5 mg take 1 tablet once a day as needed for runny nose and itching. Continue saline nasal rinses as needed for nasal symptoms. Use this before any medicated nasal sprays for best result Continue allergen avoidance measures directed toward pollens, mold, and dust mites as listed below  Allergic conjunctivitis Continue Opcon-A using 1 drop 3 times a day as needed for itchy watery eyes. Some other over the counter eye drops include Pataday one drop in each eye once a day as needed for red, itchy eyes OR Zaditor one drop in each eye twice a day as needed for red itchy eyes.  Reflux Continue dietary and lifestyle modifications as listed below Begin omeprazole 20 mg once a day to control reflux  Please let us know if this treatment plan is not working well for you.  Follow up in 6 months or sooner if needed.  Reducing Pollen Exposure The American Academy of Allergy, Asthma and Immunology suggests the following steps to reduce your exposure to pollen during allergy seasons. Do not hang sheets or clothing out to dry; pollen may collect on these items. Do not mow lawns or spend time around freshly cut grass; mowing stirs up pollen. Keep windows closed at night.  Keep car windows closed while driving. Minimize morning  activities outdoors, a time when pollen counts are usually at their highest. Stay indoors as much as possible when pollen counts or humidity is high and on windy days when pollen tends to remain in the air longer. Use air conditioning when possible.  Many air conditioners have filters that trap the pollen spores. Use a HEPA room air filter to remove pollen form the indoor air you breathe.   Control of Mold Allergen Mold and fungi can grow on a variety of surfaces provided certain temperature and moisture conditions exist.  Outdoor molds grow on plants, decaying vegetation and soil.  The major outdoor mold, Alternaria and Cladosporium, are found in very high numbers during hot and dry conditions.  Generally, a late Summer - Fall peak is seen for common outdoor fungal spores.  Rain will temporarily lower outdoor mold spore count, but counts rise rapidly when the rainy period ends.  The most important indoor molds are Aspergillus and Penicillium.  Dark, humid and poorly ventilated basements are ideal sites for mold growth.  The next most common sites of mold growth are the bathroom and the kitchen.  Outdoor [employer] Use air conditioning and keep windows closed Avoid exposure to decaying vegetation. Avoid leaf raking. Avoid grain handling. Consider wearing a face mask if working in moldy areas.  Indoor Mold Control Maintain humidity below 50%. Clean washable surfaces with 5% bleach solution. Remove sources e.g. Contaminated carpets.   Control of Dust Mite Allergen Dust mites play a major role  in allergic asthma and rhinitis. They occur in environments with high humidity wherever human skin is found. Dust mites absorb humidity from the atmosphere (ie, they do not drink) and feed on organic matter (including shed human and animal skin). Dust mites are a microscopic type of insect that you cannot see with the naked eye. High levels of dust mites have been detected from mattresses, pillows,  carpets, upholstered furniture, bed covers, clothes, soft toys and any woven material. The principal allergen of the dust mite is found in its feces. A gram of dust may contain 1,000 mites and 250,000 fecal particles. Mite antigen is easily measured in the air during house cleaning activities. Dust mites do not bite and do not cause harm to humans, other than by triggering allergies/asthma.  Ways to decrease your exposure to dust mites in your home:  1. Encase mattresses, box springs and pillows with a mite-impermeable barrier or cover  2. Wash sheets, blankets and drapes weekly in hot water (130 F) with detergent and dry them in a dryer on the hot setting.  3. Have the room cleaned frequently with a vacuum cleaner and a damp dust-mop. For carpeting or rugs, vacuuming with a vacuum cleaner equipped with a high-efficiency particulate air (HEPA) filter. The dust mite allergic individual should not be in a room which is being cleaned and should wait 1 hour after cleaning before going into the room.  4. Do not sleep on upholstered furniture (eg, couches).  5. If possible removing carpeting, upholstered furniture and drapery from the home is ideal. Horizontal blinds should be eliminated in the rooms where the person spends the most time (bedroom, study, television room). Washable vinyl, roller-type shades are optimal.  6. Remove all non-washable stuffed toys from the bedroom. Wash stuffed toys weekly like sheets and blankets above.  7. Reduce indoor humidity to less than 50%. Inexpensive humidity monitors can be purchased at most hardware stores. Do not use a humidifier as can make the problem worse and are not recommended.

## 2021-11-23 ENCOUNTER — Ambulatory Visit: Payer: 59 | Admitting: Family Medicine

## 2021-11-23 ENCOUNTER — Encounter: Payer: Self-pay | Admitting: Family Medicine

## 2021-11-23 VITALS — BP 130/86 | HR 60 | Temp 97.9°F | Resp 16 | Ht 72.0 in | Wt 172.4 lb

## 2021-11-23 DIAGNOSIS — K219 Gastro-esophageal reflux disease without esophagitis: Secondary | ICD-10-CM

## 2021-11-23 DIAGNOSIS — H1013 Acute atopic conjunctivitis, bilateral: Secondary | ICD-10-CM

## 2021-11-23 DIAGNOSIS — J302 Other seasonal allergic rhinitis: Secondary | ICD-10-CM

## 2021-11-23 DIAGNOSIS — J3089 Other allergic rhinitis: Secondary | ICD-10-CM

## 2021-11-23 DIAGNOSIS — J4540 Moderate persistent asthma, uncomplicated: Secondary | ICD-10-CM | POA: Diagnosis not present

## 2021-11-28 ENCOUNTER — Other Ambulatory Visit: Payer: Self-pay | Admitting: Family Medicine

## 2021-12-01 ENCOUNTER — Encounter: Payer: Self-pay | Admitting: Family Medicine

## 2021-12-02 MED ORDER — AIRDUO DIGIHALER 113-14 MCG/ACT IN AEPB: INHALATION_SPRAY | RESPIRATORY_TRACT | 2 refills | Status: DC

## 2021-12-02 NOTE — Telephone Encounter (Signed)
Perfect. Can you please order AirDuo 113-1 puff twice a day for this patient. Thank you

## 2021-12-15 NOTE — Telephone Encounter (Signed)
Can you please order AirDuo 113 for this patient through the specialty pharmacy with the coupon card? Thank you

## 2021-12-15 NOTE — Telephone Encounter (Signed)
Patient called and said [facility] called him and said that we need to do a prior [place]. [facility] [employer]. [phone]

## 2021-12-16 ENCOUNTER — Other Ambulatory Visit: Payer: Self-pay

## 2021-12-16 MED ORDER — AIRDUO DIGIHALER 113-14 MCG/ACT IN AEPB: INHALATION_SPRAY | RESPIRATORY_TRACT | 2 refills | Status: DC

## 2021-12-16 NOTE — Progress Notes (Signed)
AirDuo Digihaler 113-14 MCG/ACT AEPB   [facility] cost w/out insurance or PA: over $500                     W/coupon: $150  Per provider request: Rx being sent to specialty pharmacy:   [facility], [place] [phone]  Patient will be contacted by specialty pharmacy before inhaler is mailed to him.

## 2021-12-21 ENCOUNTER — Other Ambulatory Visit: Payer: Self-pay | Admitting: Family Medicine

## 2021-12-21 NOTE — Telephone Encounter (Signed)
Patient called into office inquiring about the status of his AirDuo. Advised patient that the medication has been sent to the specialty pharmacy and gave the patient the number so that he can call and inquire about the status.

## 2021-12-26 ENCOUNTER — Telehealth: Payer: Self-pay

## 2021-12-26 ENCOUNTER — Encounter: Payer: Self-pay | Admitting: Family Medicine

## 2021-12-26 NOTE — Telephone Encounter (Signed)
Per PA request:  Non-preferred medications may have a higher patient co-pay than the health insurance plan's preferred medications.  Alternatives that do not require PA:  Advair HFA Not Required Adair Patter Not Required Judithann Sauger Aerosphere Not Required  Called  - [facility]/[phone] for status/ update of patient's prescription.  I did send another Pa with more documentation in hopes of an approval from [facility]:  KEY: [id number] - PA Case ID: [id number] - RX#: [id number] created: 12/27/21  Forwarding message to provider for review.

## 2021-12-28 MED ORDER — ADVAIR HFA 115-21 MCG/ACT IN AERO: INHALATION_SPRAY | RESPIRATORY_TRACT | 12 refills | Status: DC

## 2021-12-28 NOTE — Addendum Note (Signed)
Addended by: Tommas Olp B on: 12/28/2021 01:15 PM   Modules accepted: Orders

## 2021-12-28 NOTE — Telephone Encounter (Signed)
[facility] -spoke to [place] verified - advised patient's insurance will not cover generic AirDuo RespiClick either. Christian Carroll stated they could offer patient cash price $75. I advised I would contact patient to update him and give her a callback if this ia okay with him.  Called patient - DOB verified - advised of the above notation from [facility] and provider's notation:  Any luck with [place] pharmacy? If not lets change him to any other duel inhaler that's covered. We can start out with Advair 115-2 puffs twice a day to prevent cough or wheeze. If not covered, please call for a list of duel inhalers that are covered. This patient is going out of the country soon so quick resolution would be great. Thank you so much  Patient stated he would like to see how much Advair would cost through his insurance due to the cash price for the generic AirDuo RespiClick being on the high end.   Electronically sending in prescription for Advair to [place] message to provider as update.

## 2021-12-28 NOTE — Telephone Encounter (Signed)
Any luck with [facility] pharmacy? If not lets change him to any other duel inhaler that's covered. We can start out with Advair 115-2 puffs twice a day to prevent cough or wheeze. If not covered, please call for a list of duel inhalers that are covered. This patient is going out of the country soon so quick resolution would be great. Thank you so much

## 2021-12-30 ENCOUNTER — Other Ambulatory Visit: Payer: Self-pay | Admitting: Family Medicine

## 2021-12-30 NOTE — Telephone Encounter (Signed)
Advair HFA 115-21 was denied : Non-preferred medications may have a higher patient co-pay than the health insurance plan's preferred medications.  PA submitted: KEY: [id number] - PA Case ID: PA - [id number] created 12/30/21

## 2022-01-23 NOTE — Progress Notes (Unsigned)
FOLLOW UP Date of Service/Encounter:  01/25/22   Subjective:  Christian Carroll (DOB: 04-05-1976) is a 46 y.o. male who returns to the [facility] on 01/25/2022 in re-evaluation of the following: asthma, allergic rhinitis, allergic conjunctivitis, and reflux History obtained from: chart review and patient.  For Review, LV was on 11/23/21  with Gareth Morgan, FNP seen for routine follow-up.  Asthma not controlled.  He was started on AirDuo to replace Flovent and continued on montelukast.  His FEV1 was 3.76 L, read as normal function.  Allergic rhinitis and conjunctivitis were controlled, no changes were made.  Reflux was not controlled and omeprazole was added to his plan.  environmental allergy skin testing was on 05/06/2019 and was positive to pollens, mold, and dust mites  Today presents for follow-up. He did a trial with AirDuo and saw an instant improvement in his asthma, but this was not covered so switched to Advair 115, 2 puffs BID.  Doesn't feel Advair is quite as good as the AirDuo, but doing much better than when on Flovent.  Hasn't used rescue inhaler in at least over a week.  He feels the Advair is working now, but took longer than the AirDuo to feel the effect.  He continues on Xyzal and Singulair at night.  He is using his Xhance 1 spray in each nostril twice a day.  His reflux is intermittently uncontrolled, usually depending on dietary or lifestyle factors such as caffeine, wine, or tomato consumption as well as intense exercise after eating.  He does not like taking a PPI chronically due to concerns regarding side effects.  He will occasionally take omeprazole as needed and this seems to resolve his symptoms.    Allergies as of 01/25/2022   No Known Allergies      Medication List        Accurate as of January 25, 2022  5:05 PM. If you have any questions, ask your nurse or doctor.          Advair HFA 115-21 MCG/ACT inhaler Generic drug:  fluticasone-salmeterol 2 puffs 2 (TWO) times a day to prevent cough or wheeze.   albuterol 108 (90 Base) MCG/ACT inhaler Commonly known as: VENTOLIN HFA INHALE 2 PUFFS INTO THE LUNGS BY MOUTH EVERY 4 HOURS AS NEEDED FOR WHEEZING  OR SHORTNESS OF BREATH   fluticasone 110 MCG/ACT inhaler Commonly known as: Flovent HFA Continue Flovent 110-2 puffs once a day with a spacer to prevent cough or wheeze.  For asthma flare, increase Flovent 110 to 2 puffs twice a day for 2 weeks or until cough and wheeze free   ibuprofen 200 MG tablet Commonly known as: ADVIL Take 200 mg by mouth every 6 (six) hours as needed. prn   levocetirizine 5 MG tablet Commonly known as: XYZAL TAKE 1 TABLET BY MOUTH ONCE A DAY AS NEEDED FOR RUNNY NOSE AND ITCHING   montelukast 10 MG tablet Commonly known as: SINGULAIR TAKE ONE TABLET BY MOUTH ONE TIME DAILY TO HELP PREVENT COUGH AND WHEEZE   Xhance 93 MCG/ACT [facility] Generic drug: Fluticasone Propionate 2 sprays in each nostril twice a day for nasal congestion       Past Medical History:  Diagnosis Date   Allergic rhinitis    Asthma    mild 12/18/2017   left inner thigh   Past Surgical History:  Procedure Laterality Date   CYST REMOVAL NECK     Otherwise, there have been no changes to his past medical history,  surgical history, family history, or social history.  ROS: All others negative except as noted per HPI.   Objective:  BP 130/80   Pulse 61   Temp 98.6 F (37 C) (Temporal)   Resp 16   Wt 169 lb 6.4 oz (76.8 kg)   SpO2 99%   BMI 22.97 kg/m  Body mass index is 22.97 kg/m. Physical Exam: General Appearance:  Alert, cooperative, no distress, appears stated age  Head:  Normocephalic, without obvious abnormality, atraumatic  Eyes:  Conjunctiva clear, EOM's intact  Nose: Nares normal, hypertrophic turbinates, normal mucosa, no visible anterior polyps, and septum midline  Throat: Lips, tongue normal; teeth and gums normal, normal posterior  oropharynx  Neck: Supple, symmetrical  Lungs:   clear to auscultation bilaterally, Respirations unlabored, no coughing  Heart:  regular rate and rhythm and no murmur, Appears well perfused  Extremities: No edema  Skin: Skin color, texture, turgor normal, no rashes or lesions on visualized portions of skin  Neurologic: No gross deficits   Spirometry:  Tracings reviewed. His effort: Good reproducible efforts. FVC: 6.25L FEV1: 4.47L, 106% predicted FEV1/FVC ratio: 0.72 Interpretation: Spirometry consistent with normal pattern.  Please see scanned spirometry results for details.   Assessment/Plan   Asthma-improved.   His breathing test looked normal today and improved from last visit Continue Advair 115, 2 puffs twice daily.   Use with spacer, wash mouth out after use.  Spacer provided in clinic. Continue montelukast 10 mg once a day to help prevent cough and wheeze Continue albuterol 2 puffs every 4 hours as needed for coughing, wheezing, tightness in chest or shortness of breath. Continue albuterol 2 puffs 5 to 15 minutes prior to exercise.  Allergic rhinitis Continue Xhance 1-2 sprays in each nostril twice a day for nasal congestion. Continue to follow up with your eye doctor Continue Xyzal 5 mg 1 tablet once a day as needed for runny nose and itching. Continue saline nasal rinses as needed for nasal symptoms. Use this before any medicated nasal sprays for best result Continue allergen avoidance measures directed toward pollens, mold, and dust mites as listed below  Allergic conjunctivitis Continue Opcon-A using 1 drop 3 times a day as needed for itchy watery eyes. Some other over the counter eye drops include Pataday one drop in each eye once a day as needed for red, itchy eyes OR Zaditor one drop in each eye twice a day as needed for red itchy eyes.  Reflux Continue dietary and lifestyle modifications as listed below Omeprazole 20 mg once a day as needed to control reflux.    Please let us know if this treatment plan is not working well for you.  Follow up in 6 months or sooner if needed. It was a pleasure meeting you today!  Sigurd Sos, MD  [facility] [place] [facility] [place]

## 2022-01-24 ENCOUNTER — Other Ambulatory Visit: Payer: Self-pay | Admitting: Family Medicine

## 2022-01-25 ENCOUNTER — Ambulatory Visit: Payer: 59 | Admitting: Internal Medicine

## 2022-01-25 ENCOUNTER — Encounter: Payer: Self-pay | Admitting: Internal Medicine

## 2022-01-25 VITALS — BP 130/80 | HR 61 | Temp 98.6°F | Resp 16 | Wt 169.4 lb

## 2022-01-25 DIAGNOSIS — J302 Other seasonal allergic rhinitis: Secondary | ICD-10-CM

## 2022-01-25 DIAGNOSIS — K219 Gastro-esophageal reflux disease without esophagitis: Secondary | ICD-10-CM | POA: Diagnosis not present

## 2022-01-25 DIAGNOSIS — J4540 Moderate persistent asthma, uncomplicated: Secondary | ICD-10-CM | POA: Diagnosis not present

## 2022-01-25 DIAGNOSIS — J3089 Other allergic rhinitis: Secondary | ICD-10-CM | POA: Diagnosis not present

## 2022-01-25 DIAGNOSIS — H1013 Acute atopic conjunctivitis, bilateral: Secondary | ICD-10-CM | POA: Diagnosis not present

## 2022-01-25 NOTE — Patient Instructions (Addendum)
Asthma Continue Advair 115, 2 puffs twice daily.   Use with spacer, wash mouth out after use. Continue montelukast 10 mg once a day to help prevent cough and wheeze Continue albuterol 2 puffs every 4 hours as needed for coughing, wheezing, tightness in chest or shortness of breath. Continue albuterol 2 puffs 5 to 15 minutes prior to exercise.  Allergic rhinitis Continue Xhance 1-2 sprays in each nostril twice a day for nasal congestion. Continue to follow up with your eye doctor Continue Xyzal 5 mg 1 tablet once a day as needed for runny nose and itching. Continue saline nasal rinses as needed for nasal symptoms. Use this before any medicated nasal sprays for best result Continue allergen avoidance measures directed toward pollens, mold, and dust mites as listed below  Allergic conjunctivitis Continue Opcon-A using 1 drop 3 times a day as needed for itchy watery eyes. Some other over the counter eye drops include Pataday one drop in each eye once a day as needed for red, itchy eyes OR Zaditor one drop in each eye twice a day as needed for red itchy eyes.  Reflux Continue dietary and lifestyle modifications as listed below Omeprazole 20 mg once a day as needed to control reflux.   Please let us know if this treatment plan is not working well for you.  Follow up in 6 months or sooner if needed. It was a pleasure meeting you today!  Reducing Pollen Exposure The American Academy of Allergy, Asthma and Immunology suggests the following steps to reduce your exposure to pollen during allergy seasons. Do not hang sheets or clothing out to dry; pollen may collect on these items. Do not mow lawns or spend time around freshly cut grass; mowing stirs up pollen. Keep windows closed at night.  Keep car windows closed while driving. Minimize morning activities outdoors, a time when pollen counts are usually at their highest. Stay indoors as much as possible when pollen counts or humidity is high and  on windy days when pollen tends to remain in the air longer. Use air conditioning when possible.  Many air conditioners have filters that trap the pollen spores. Use a HEPA room air filter to remove pollen form the indoor air you breathe.   Control of Mold Allergen Mold and fungi can grow on a variety of surfaces provided certain temperature and moisture conditions exist.  Outdoor molds grow on plants, decaying vegetation and soil.  The major outdoor mold, Alternaria and Cladosporium, are found in very high numbers during hot and dry conditions.  Generally, a late Summer - Fall peak is seen for common outdoor fungal spores.  Rain will temporarily lower outdoor mold spore count, but counts rise rapidly when the rainy period ends.  The most important indoor molds are Aspergillus and Penicillium.  Dark, humid and poorly ventilated basements are ideal sites for mold growth.  The next most common sites of mold growth are the bathroom and the kitchen.  Outdoor [employer] Use air conditioning and keep windows closed Avoid exposure to decaying vegetation. Avoid leaf raking. Avoid grain handling. Consider wearing a face mask if working in moldy areas.  Indoor Mold Control Maintain humidity below 50%. Clean washable surfaces with 5% bleach solution. Remove sources e.g. Contaminated carpets.   Control of Dust Mite Allergen Dust mites play a major role in allergic asthma and rhinitis. They occur in environments with high humidity wherever human skin is found. Dust mites absorb humidity from the atmosphere (ie, they do not  drink) and feed on organic matter (including shed human and animal skin). Dust mites are a microscopic type of insect that you cannot see with the naked eye. High levels of dust mites have been detected from mattresses, pillows, carpets, upholstered furniture, bed covers, clothes, soft toys and any woven material. The principal allergen of the dust mite is found in its feces. A gram of  dust may contain 1,000 mites and 250,000 fecal particles. Mite antigen is easily measured in the air during house cleaning activities. Dust mites do not bite and do not cause harm to humans, other than by triggering allergies/asthma.  Ways to decrease your exposure to dust mites in your home:  1. Encase mattresses, box springs and pillows with a mite-impermeable barrier or cover  2. Wash sheets, blankets and drapes weekly in hot water (130 F) with detergent and dry them in a dryer on the hot setting.  3. Have the room cleaned frequently with a vacuum cleaner and a damp dust-mop. For carpeting or rugs, vacuuming with a vacuum cleaner equipped with a high-efficiency particulate air (HEPA) filter. The dust mite allergic individual should not be in a room which is being cleaned and should wait 1 hour after cleaning before going into the room.  4. Do not sleep on upholstered furniture (eg, couches).  5. If possible removing carpeting, upholstered furniture and drapery from the home is ideal. Horizontal blinds should be eliminated in the rooms where the person spends the most time (bedroom, study, television room). Washable vinyl, roller-type shades are optimal.  6. Remove all non-washable stuffed toys from the bedroom. Wash stuffed toys weekly like sheets and blankets above.  7. Reduce indoor humidity to less than 50%. Inexpensive humidity monitors can be purchased at most hardware stores. Do not use a humidifier as can make the problem worse and are not recommended.  Gastroesophageal Reflux Induced Respiratory Disease and Laryngopharyngeal Reflux (LPR): Gastroesophageal reflux disease (GERD) is a condition where the contents of the stomach reflux or back up into the esophagus or swallowing tube.  This can result in a variety of clinical symptoms including classic symptoms and atypical symptoms.  Classic symptoms of GERD include: heartburn, chest pain, acid taste in the mouth, and difficulty in  swallowing.  Atypical symptoms of GERD include laryngopharyngeal reflux (LPR) and asthma.  LPR occurs when stomach reflux comes all the way up to the throat.  Clinical symptoms include hoarseness, raspy voice, laryngitis, throat clearing, postnasal drip, mucus stuck in the throat, a sensation of a lump in the throat, sore throat, and cough.  Most patients with LPR do not have classic symptoms of GERD.  Asthma can also be triggered by GERD.  The acid stomach fluid can stimulate nerve fibers in the esophagus which can cause an increase in bronchial muscle tone and narrowing of the airways.  Acid stomach contents may also reflux into the trachea and bronchi of the lungs where it can trigger an asthma attack.  Many people with GERD triggered asthma do not have classic symptoms of GERD.  Diagnosis of LPR and GERD induced asthma is frequently made from a typical history and response to medications.  It may take several months of medications to see a good response.  Occasionally, a 24-hour esophageal pH probe study must be performed.  Treatment of GERD/LPR includes:   Modification of diet and lifestyle Stop smoking Avoid overeating and lose weight Avoid acidic and fatty foods, chocolate, onions, garlic, peppermint Elevate the head of your bed 6  to 8 inches with blocks or wedge Medications Zantac, Pepcid, Axid, Tagamet Prilosec, Prevacid, Aciphex, Protonix, Nexium Surgery

## 2022-04-06 ENCOUNTER — Encounter: Payer: Self-pay | Admitting: Family Medicine

## 2022-04-07 ENCOUNTER — Other Ambulatory Visit: Payer: Self-pay | Admitting: *Deleted

## 2022-04-07 MED ORDER — FLUTICASONE PROPIONATE 50 MCG/ACT NA SUSP: 2.0000 | Freq: Two times a day (BID) | NASAL | 5 refills | Status: DC

## 2022-04-07 NOTE — Telephone Encounter (Signed)
Can replace with Flonase, nasacort, or nasonex-2 sprays in each nostril once a day. Thank you

## 2022-04-12 ENCOUNTER — Ambulatory Visit: Payer: 59 | Admitting: Physician Assistant

## 2022-05-25 ENCOUNTER — Encounter: Payer: Self-pay | Admitting: Family Medicine

## 2022-05-26 ENCOUNTER — Other Ambulatory Visit: Payer: Self-pay

## 2022-05-26 MED ORDER — XHANCE 93 MCG/ACT NA EXHU: INHALANT_SUSPENSION | NASAL | 4 refills | Status: DC

## 2022-05-26 NOTE — Telephone Encounter (Signed)
Can you please send Christian Carroll for this patient? 2 sprays in each nostril up to twice a day. Thank you

## 2022-06-01 MED ORDER — XHANCE 93 MCG/ACT NA EXHU
INHALANT_SUSPENSION | NASAL | 4 refills | Status: DC
Start: 2022-06-01 — End: 2022-08-23

## 2022-06-01 NOTE — Addendum Note (Signed)
Addended by: Eloy End D on: 06/01/2022 10:10 AM   Modules accepted: Orders

## 2022-06-14 ENCOUNTER — Telehealth: Payer: Self-pay

## 2022-06-14 ENCOUNTER — Other Ambulatory Visit (HOSPITAL_COMMUNITY): Payer: Self-pay

## 2022-06-14 NOTE — Telephone Encounter (Signed)
I guess, due to insurance, we will need to go back to flonase which was not working as well. Thank you

## 2022-06-14 NOTE — Telephone Encounter (Signed)
Patient Advocate Encounter   Received notification from [employer] that prior authorization is required for [facility] 93MCG/ACT exhaler suspension  Submitted: 06-14-2022 Key [id number]  Status is pending

## 2022-06-14 NOTE — Telephone Encounter (Signed)
Patient Advocate Encounter  Received a fax from [employer] regarding Prior Authorization for [facility] 93MCG/ACT exhaler suspension .   Authorization has been DENIED due to The requested medication and/or diagnosis are not a covered benefit and are excluded from coverage in accordance with the terms and conditions of your plan benefit. Therefore, this request has been administratively denied.

## 2022-06-14 NOTE — Telephone Encounter (Signed)
Pa for xhance is denied please advise to change in therapy

## 2022-06-26 MED ORDER — FLUTICASONE PROPIONATE 50 MCG/ACT NA SUSP: 2.0000 | Freq: Every day | NASAL | 5 refills | Status: DC

## 2022-06-26 NOTE — Addendum Note (Signed)
Addended by: Felipa Emory on: 06/26/2022 07:59 AM   Modules accepted: Orders

## 2022-06-26 NOTE — Telephone Encounter (Signed)
Sent in flonase to pts pharmacy

## 2022-07-14 ENCOUNTER — Other Ambulatory Visit: Payer: Self-pay | Admitting: Family Medicine

## 2022-08-09 ENCOUNTER — Ambulatory Visit: Payer: 59 | Admitting: Internal Medicine

## 2022-08-21 ENCOUNTER — Other Ambulatory Visit: Payer: Self-pay | Admitting: Family Medicine

## 2022-08-21 NOTE — Progress Notes (Unsigned)
FOLLOW UP Date of Service/Encounter:  08/23/22   Subjective:  Christian Carroll (DOB: Feb 03, 1976) is a 47 y.o. male who returns to the [facility] on 08/23/2022 in re-evaluation of the following: asthma, allergic rhinitis, allergic conjunctivitis, and reflux  History obtained from: chart review and patient.  For Review, LV was on 01/25/22  with Dr.Zandra Lajeunesse seen for routine follow-up. He has noted a significant difference in breathing since starting AirDuo, but this was not covered so he was switched to [place] which he didn't feel was as helpful.  Pertinent history/diagnostics: Asthma:  Failed Flovent. Improved on ICS/LABA combo. Allergic rhinitis:  Has reflux, partially controlled with diet and omeprazole PRN. Has concerns regarding side effects so avoids daily. environmental allergy skin testing was on 05/06/2019 and was positive to pollens, mold, and dust mites   Today presents for follow-up. He was doing well until earlier this week when weather was nice and he was outside.  Afterwards noticed increase in congestion and PND.   Flonase doesn't help as much as Truett Perna, but Truett Perna is not covered by his insurance.  It is costing around $100 dollars per month.  He likes the delivery for Truett Perna much better so is using them both alternating to cut down on use of Xhance.  He continues to feel Advair was not as effective as Airduo, but this is no longer covered by his insurance. He needs albuterol prior to or after strenuous exercise. Cold air is a trigger. He is usually fine in the summer.  He feels he uses albuterol maybe at most twice per week.  Occasionally no more than once per week he will notice SOB when at work and not particularly active. Reflux is stable. He continues to drink wine and coffee. He takes omeprazole around 2-3 days of the week for heartburn symptoms. He did have a viral illness with Gi symptoms a few weeks and was needing more regularly during that time..      Allergies as of 08/23/2022   No Active Allergies      Medication List        Accurate as of August 23, 2022 12:00 PM. If you have any questions, ask your nurse or doctor.          STOP taking these medications    Advair HFA 115-21 MCG/ACT inhaler Generic drug: fluticasone-salmeterol Stopped by: Clemon Chambers, MD   fluticasone 110 MCG/ACT inhaler Commonly known as: Flovent HFA Stopped by: Clemon Chambers, MD       TAKE these medications    albuterol 108 (90 Base) MCG/ACT inhaler Commonly known as: VENTOLIN HFA INHALE 2 PUFFS INTO THE LUNGS BY MOUTH EVERY 4 HOURS AS NEEDED FOR WHEEZING  OR SHORTNESS OF BREATH   ibuprofen 200 MG tablet Commonly known as: ADVIL Take 200 mg by mouth every 6 (six) hours as needed. prn   latanoprost 0.005 % ophthalmic solution Commonly known as: XALATAN Place 1 drop into both eyes at bedtime.   levocetirizine 5 MG tablet Commonly known as: XYZAL TAKE 1 TABLET BY MOUTH ONCE A DAY AS NEEDED FOR RUNNY NOSE AND ITCHING   montelukast 10 MG tablet Commonly known as: SINGULAIR take 1 tablet by mouth once a day to help prevent cough and wheeze   omeprazole 20 MG capsule Commonly known as: PRILOSEC Take 20 mg by mouth as needed.   Xhance 93 MCG/ACT Exhu Generic drug: Fluticasone Propionate 2 sprays in each nostril twice a day for nasal congestion  fluticasone 50 MCG/ACT nasal spray Commonly known as: FLONASE Place 2 sprays into both nostrils daily.       Past Medical History:  Diagnosis Date   Allergic rhinitis    Asthma    mild 12/18/2017   left inner thigh   Past Surgical History:  Procedure Laterality Date   CYST REMOVAL NECK     Otherwise, there have been no changes to his past medical history, surgical history, family history, or social history.  ROS: All others negative except as noted per HPI.   Objective:  BP 130/88 (BP Location: Left Arm, Patient Position: Sitting, Cuff Size: Normal)   Pulse 62   Temp 98.2  F (36.8 C) (Temporal)   Resp 16   Ht '5\' 10"'$  (1.778 m)   Wt 167 lb (75.8 kg)   SpO2 99%   BMI 23.96 kg/m  Body mass index is 23.96 kg/m. Physical Exam: General Appearance:  Alert, cooperative, no distress, appears stated age  Head:  Normocephalic, without obvious abnormality, atraumatic  Eyes:  Conjunctiva clear, EOM's intact  Nose: Nares normal, hypertrophic turbinates, normal mucosa, no visible anterior polyps, and septum midline  Throat: Lips, tongue normal; teeth and gums normal, normal posterior oropharynx and + cobblestoning  Neck: Supple, symmetrical  Lungs:   clear to auscultation bilaterally, Respirations unlabored, no coughing  Heart:  regular rate and rhythm and no murmur, Appears well perfused  Extremities: No edema  Skin: Skin color, texture, turgor normal, no rashes or lesions on visualized portions of skin  Neurologic: No gross deficits  Spirometry:  Tracings reviewed. His effort: Good reproducible efforts. FVC: 5.87L FEV1: 4.23L, 101% predicted FEV1/FVC ratio: 0.72 Interpretation: Spirometry consistent with normal pattern.  Please see scanned spirometry results for details.  Assessment/Plan   Asthma-controlled Start  Symbicort 80 mcg, 2 puffs twice daily.   Use with spacer, wash mouth out after use. This will replace Advair 45 Continue montelukast 10 mg once a day to help prevent cough and wheeze Continue albuterol 2 puffs every 4 hours as needed for coughing, wheezing, tightness in chest or shortness of breath. Continue albuterol 2 puffs 5 to 15 minutes prior to exercise. If needing albuterol more than twice per week outside setting of exercise, let us know.  Allergic rhinitis-controlled Continue Xhance 1-2 sprays in each nostril twice a day for nasal congestion. Continue to follow up with your eye doctor--we will now send to [place] If this is not any more affordable, let me know and I will send in budesonide (pulmicort) to be used in [employer] which you  will need to purchase ([email or web address] has 20% off coupon) Continue Xyzal 5 mg 1 tablet once a day as needed for runny nose and itching. Continue saline nasal rinses as needed for nasal symptoms. Use this before any medicated nasal sprays for best result Continue allergen avoidance measures directed toward pollens, mold, and dust mites as listed below  Allergic conjunctivitis-controlled Continue Opcon-A using 1 drop 3 times a day as needed for itchy watery eyes. Some other over the counter eye drops include Pataday one drop in each eye once a day as needed for red, itchy eyes OR Zaditor one drop in each eye twice a day as needed for red itchy eyes.  Reflux-stable Continue dietary and lifestyle modifications as listed below Omeprazole 20 mg once a day-consider 4-6 weeks of taking daily 30 minutes prior to meals. Then can take famotidine 20 mg daily as needed if still having reflux.  Please let us know  if this treatment plan is not working well for you.  Follow up in 6 months or sooner if needed. It was a pleasure seeing you again today!  Sigurd Sos, MD  [facility] [place]

## 2022-08-23 ENCOUNTER — Telehealth: Payer: Self-pay

## 2022-08-23 ENCOUNTER — Other Ambulatory Visit: Payer: Self-pay

## 2022-08-23 ENCOUNTER — Ambulatory Visit: Payer: 59 | Admitting: Internal Medicine

## 2022-08-23 ENCOUNTER — Encounter: Payer: Self-pay | Admitting: Internal Medicine

## 2022-08-23 VITALS — BP 130/88 | HR 62 | Temp 98.2°F | Resp 16 | Ht 70.0 in | Wt 167.0 lb

## 2022-08-23 DIAGNOSIS — H1013 Acute atopic conjunctivitis, bilateral: Secondary | ICD-10-CM | POA: Diagnosis not present

## 2022-08-23 DIAGNOSIS — J4540 Moderate persistent asthma, uncomplicated: Secondary | ICD-10-CM

## 2022-08-23 DIAGNOSIS — J3089 Other allergic rhinitis: Secondary | ICD-10-CM

## 2022-08-23 DIAGNOSIS — K219 Gastro-esophageal reflux disease without esophagitis: Secondary | ICD-10-CM | POA: Diagnosis not present

## 2022-08-23 DIAGNOSIS — J302 Other seasonal allergic rhinitis: Secondary | ICD-10-CM

## 2022-08-23 MED ORDER — MONTELUKAST SODIUM 10 MG PO TABS: ORAL_TABLET | ORAL | 5 refills | Status: DC

## 2022-08-23 MED ORDER — ALBUTEROL SULFATE HFA 108 (90 BASE) MCG/ACT IN AERS: INHALATION_SPRAY | RESPIRATORY_TRACT | 2 refills | Status: DC

## 2022-08-23 MED ORDER — SYMBICORT 80-4.5 MCG/ACT IN AERO: 2.0000 | INHALATION_SPRAY | Freq: Two times a day (BID) | RESPIRATORY_TRACT | 5 refills | Status: DC

## 2022-08-23 MED ORDER — LEVOCETIRIZINE DIHYDROCHLORIDE 5 MG PO TABS: ORAL_TABLET | ORAL | 1 refills | Status: DC

## 2022-08-23 MED ORDER — XHANCE 93 MCG/ACT NA EXHU: INHALANT_SUSPENSION | NASAL | 4 refills | Status: DC

## 2022-08-23 NOTE — Telephone Encounter (Signed)
[place]ak[place] PA from [facility]/[phone]  Key Code: [id number]  Exp: 09/22/22 12:25   Forwarding message to PA Team to initiate PA.

## 2022-08-23 NOTE — Patient Instructions (Addendum)
Asthma Start Symbicort 80 mcg, 2 puffs twice daily.   Use with spacer, wash mouth out after use. This will replace Advair 45 Continue montelukast 10 mg once a day to help prevent cough and wheeze Continue albuterol 2 puffs every 4 hours as needed for coughing, wheezing, tightness in chest or shortness of breath. Continue albuterol 2 puffs 5 to 15 minutes prior to exercise. If needing albuterol more than twice per week outside setting of exercise, let us know.  Allergic rhinitis Continue Xhance 1-2 sprays in each nostril twice a day for nasal congestion. Continue to follow up with your eye doctor--we will now send to [place] If this is not any more affordable, let me know and I will send in budesonide (pulmicort) to be used in [employer] which you will need to purchase ([email or web address] has 20% off coupon) Continue Xyzal 5 mg 1 tablet once a day as needed for runny nose and itching. Continue saline nasal rinses as needed for nasal symptoms. Use this before any medicated nasal sprays for best result Continue allergen avoidance measures directed toward pollens, mold, and dust mites as listed below  Allergic conjunctivitis Continue Opcon-A using 1 drop 3 times a day as needed for itchy watery eyes. Some other over the counter eye drops include Pataday one drop in each eye once a day as needed for red, itchy eyes OR Zaditor one drop in each eye twice a day as needed for red itchy eyes.  Reflux Continue dietary and lifestyle modifications as listed below Omeprazole 20 mg once a day-consider 4-6 weeks of taking daily 30 minutes prior to meals. Then can take famotidine 20 mg daily as needed if still having reflux.  Please let us know if this treatment plan is not working well for you.  Follow up in 6 months or sooner if needed. It was a pleasure seeing you again today!  Reducing Pollen Exposure The American Academy of Allergy, Asthma and Immunology suggests the following steps  to reduce your exposure to pollen during allergy seasons. Do not hang sheets or clothing out to dry; pollen may collect on these items. Do not mow lawns or spend time around freshly cut grass; mowing stirs up pollen. Keep windows closed at night.  Keep car windows closed while driving. Minimize morning activities outdoors, a time when pollen counts are usually at their highest. Stay indoors as much as possible when pollen counts or humidity is high and on windy days when pollen tends to remain in the air longer. Use air conditioning when possible.  Many air conditioners have filters that trap the pollen spores. Use a HEPA room air filter to remove pollen form the indoor air you breathe.   Control of Mold Allergen Mold and fungi can grow on a variety of surfaces provided certain temperature and moisture conditions exist.  Outdoor molds grow on plants, decaying vegetation and soil.  The major outdoor mold, Alternaria and Cladosporium, are found in very high numbers during hot and dry conditions.  Generally, a late Summer - Fall peak is seen for common outdoor fungal spores.  Rain will temporarily lower outdoor mold spore count, but counts rise rapidly when the rainy period ends.  The most important indoor molds are Aspergillus and Penicillium.  Dark, humid and poorly ventilated basements are ideal sites for mold growth.  The next most common sites of mold growth are the bathroom and the kitchen.  Outdoor [employer] Use air conditioning and keep windows closed Avoid exposure to decaying  vegetation. Avoid leaf raking. Avoid grain handling. Consider wearing a face mask if working in moldy areas.  Indoor Mold Control Maintain humidity below 50%. Clean washable surfaces with 5% bleach solution. Remove sources e.g. Contaminated carpets.   Control of Dust Mite Allergen Dust mites play a major role in allergic asthma and rhinitis. They occur in environments with high humidity wherever human skin  is found. Dust mites absorb humidity from the atmosphere (ie, they do not drink) and feed on organic matter (including shed human and animal skin). Dust mites are a microscopic type of insect that you cannot see with the naked eye. High levels of dust mites have been detected from mattresses, pillows, carpets, upholstered furniture, bed covers, clothes, soft toys and any woven material. The principal allergen of the dust mite is found in its feces. A gram of dust may contain 1,000 mites and 250,000 fecal particles. Mite antigen is easily measured in the air during house cleaning activities. Dust mites do not bite and do not cause harm to humans, other than by triggering allergies/asthma.  Ways to decrease your exposure to dust mites in your home:  1. Encase mattresses, box springs and pillows with a mite-impermeable barrier or cover  2. Wash sheets, blankets and drapes weekly in hot water (130 F) with detergent and dry them in a dryer on the hot setting.  3. Have the room cleaned frequently with a vacuum cleaner and a damp dust-mop. For carpeting or rugs, vacuuming with a vacuum cleaner equipped with a high-efficiency particulate air (HEPA) filter. The dust mite allergic individual should not be in a room which is being cleaned and should wait 1 hour after cleaning before going into the room.  4. Do not sleep on upholstered furniture (eg, couches).  5. If possible removing carpeting, upholstered furniture and drapery from the home is ideal. Horizontal blinds should be eliminated in the rooms where the person spends the most time (bedroom, study, television room). Washable vinyl, roller-type shades are optimal.  6. Remove all non-washable stuffed toys from the bedroom. Wash stuffed toys weekly like sheets and blankets above.  7. Reduce indoor humidity to less than 50%. Inexpensive humidity monitors can be purchased at most hardware stores. Do not use a humidifier as can make the problem worse and are  not recommended.  Gastroesophageal Reflux Induced Respiratory Disease and Laryngopharyngeal Reflux (LPR): Gastroesophageal reflux disease (GERD) is a condition where the contents of the stomach reflux or back up into the esophagus or swallowing tube.  This can result in a variety of clinical symptoms including classic symptoms and atypical symptoms.  Classic symptoms of GERD include: heartburn, chest pain, acid taste in the mouth, and difficulty in swallowing.  Atypical symptoms of GERD include laryngopharyngeal reflux (LPR) and asthma.  LPR occurs when stomach reflux comes all the way up to the throat.  Clinical symptoms include hoarseness, raspy voice, laryngitis, throat clearing, postnasal drip, mucus stuck in the throat, a sensation of a lump in the throat, sore throat, and cough.  Most patients with LPR do not have classic symptoms of GERD.  Asthma can also be triggered by GERD.  The acid stomach fluid can stimulate nerve fibers in the esophagus which can cause an increase in bronchial muscle tone and narrowing of the airways.  Acid stomach contents may also reflux into the trachea and bronchi of the lungs where it can trigger an asthma attack.  Many people with GERD triggered asthma do not have classic symptoms of GERD.  Diagnosis of LPR and GERD induced asthma is frequently made from a typical history and response to medications.  It may take several months of medications to see a good response.  Occasionally, a 24-hour esophageal pH probe study must be performed.  Treatment of GERD/LPR includes:   Modification of diet and lifestyle Stop smoking Avoid overeating and lose weight Avoid acidic and fatty foods, chocolate, onions, garlic, peppermint Elevate the head of your bed 6 to 8 inches with blocks or wedge Medications Zantac, Pepcid, Axid, Tagamet Prilosec, Prevacid, Aciphex, Protonix, Nexium Surgery

## 2022-08-24 ENCOUNTER — Other Ambulatory Visit (HOSPITAL_COMMUNITY): Payer: Self-pay

## 2022-08-24 NOTE — Telephone Encounter (Addendum)
I will fax office notes and completed [employer] PA Form to [employer] once provider has signed form.

## 2022-08-24 NOTE — Telephone Encounter (Signed)
PA previously denied by plan due to medication not being covered.

## 2022-10-31 ENCOUNTER — Telehealth: Payer: Self-pay | Admitting: Internal Medicine

## 2022-10-31 NOTE — Telephone Encounter (Signed)
Pharmacy called stating a PA is needed for RX XHANCE 93 MCG/ACT EXHU [[id number]] please advise

## 2022-11-01 NOTE — Telephone Encounter (Signed)
Office to submit paperwork to [facility] per previous note.

## 2022-11-15 ENCOUNTER — Encounter: Payer: Self-pay | Admitting: Internal Medicine

## 2022-11-15 NOTE — Telephone Encounter (Signed)
[facility] PA has been refaxed to [phone] for review.

## 2022-11-22 ENCOUNTER — Encounter: Payer: Self-pay | Admitting: Internal Medicine

## 2022-11-23 ENCOUNTER — Other Ambulatory Visit: Payer: Self-pay | Admitting: *Deleted

## 2022-11-23 MED ORDER — XHANCE 93 MCG/ACT NA EXHU: INHALANT_SUSPENSION | NASAL | 4 refills | Status: DC

## 2022-11-27 ENCOUNTER — Other Ambulatory Visit (HOSPITAL_COMMUNITY): Payer: Self-pay

## 2022-11-29 ENCOUNTER — Telehealth: Payer: Self-pay

## 2022-11-29 NOTE — Telephone Encounter (Signed)
Please let patient know that his PA has again been denied for xhance. Let's try for budesonide nasal rinses. Instructions below- send budesonide 0.25 mg and write in free text to use twice daily as instructed for nasal rinses.  Send patient a copy of the nasal rinse instructions.  Make 240 cc (ml) of saline in the [employer] bottle using the salt packets or distilled water-DO NOT USE TAP WATER (if using tap water-boil 1-2 minutes then let cool for 5 minutes, then use) Add the entire 2 cc vial of liquid Budesonide (Pulmicort) to the rinse bottle and mix  While in the shower or over the sink, tilt your head forward to a comfortable level. Put the tip of the sinus rinse bottle in your nostril and aim it towards the crown or top of your head.  Gently squeeze the bottle to flush out your nose.  The fluid will circulate in and out of your sinus cavities, coming back out from either nostril or through your mouth. Try not to swallow large quantities and spit it out instead. 4. Preform budesonide (Pulmicort) + saline irritation 2 times daily.

## 2022-11-29 NOTE — Telephone Encounter (Signed)
Patient Advocate Encounter  Received a fax from [employer] regarding Prior Authorization for [facility] 93MCG/ACT exhaler suspension.   Authorization has been DENIED due to    Determination letter attached to patient chart

## 2022-11-29 NOTE — Telephone Encounter (Signed)
Forwarding message to provider for next step. 

## 2022-11-29 NOTE — Telephone Encounter (Signed)
Called patient - DOB/DPR verified - LMOVM in detail regarding provider notation below.  Patient advised to check his myChart for detailed directions/instructions - contact office/provider if he has any questions/concerns.

## 2023-02-17 ENCOUNTER — Other Ambulatory Visit: Payer: Self-pay | Admitting: Internal Medicine

## 2023-02-20 MED ORDER — SYMBICORT 80-4.5 MCG/ACT IN AERO: 2.0000 | INHALATION_SPRAY | Freq: Two times a day (BID) | RESPIRATORY_TRACT | 0 refills | Status: DC

## 2023-02-20 NOTE — Addendum Note (Signed)
Addended by: Robet Leu A on: 02/20/2023 03:08 PM   Modules accepted: Orders

## 2023-02-28 ENCOUNTER — Ambulatory Visit: Payer: 59 | Admitting: Internal Medicine

## 2023-03-05 ENCOUNTER — Ambulatory Visit: Payer: 59 | Admitting: Internal Medicine

## 2023-03-05 ENCOUNTER — Other Ambulatory Visit: Payer: Self-pay

## 2023-03-05 ENCOUNTER — Encounter: Payer: Self-pay | Admitting: Internal Medicine

## 2023-03-05 VITALS — BP 130/76 | HR 74 | Temp 99.8°F | Wt 161.3 lb

## 2023-03-05 DIAGNOSIS — J3089 Other allergic rhinitis: Secondary | ICD-10-CM | POA: Diagnosis not present

## 2023-03-05 DIAGNOSIS — H1013 Acute atopic conjunctivitis, bilateral: Secondary | ICD-10-CM | POA: Diagnosis not present

## 2023-03-05 DIAGNOSIS — K219 Gastro-esophageal reflux disease without esophagitis: Secondary | ICD-10-CM | POA: Diagnosis not present

## 2023-03-05 DIAGNOSIS — J4540 Moderate persistent asthma, uncomplicated: Secondary | ICD-10-CM | POA: Diagnosis not present

## 2023-03-05 DIAGNOSIS — J302 Other seasonal allergic rhinitis: Secondary | ICD-10-CM

## 2023-03-05 MED ORDER — AZELASTINE HCL 0.1 % NA SOLN: 2.0000 | Freq: Two times a day (BID) | NASAL | 5 refills | Status: DC | PRN

## 2023-03-05 MED ORDER — XHANCE 93 MCG/ACT NA EXHU: INHALANT_SUSPENSION | NASAL | 4 refills | Status: DC

## 2023-03-05 MED ORDER — MONTELUKAST SODIUM 10 MG PO TABS: ORAL_TABLET | ORAL | 5 refills | Status: DC

## 2023-03-05 MED ORDER — ALBUTEROL SULFATE HFA 108 (90 BASE) MCG/ACT IN AERS: INHALATION_SPRAY | RESPIRATORY_TRACT | 2 refills | Status: DC

## 2023-03-05 MED ORDER — LEVOCETIRIZINE DIHYDROCHLORIDE 5 MG PO TABS: ORAL_TABLET | ORAL | 1 refills | Status: DC

## 2023-03-05 MED ORDER — FLUTICASONE PROPIONATE 50 MCG/ACT NA SUSP: 2.0000 | Freq: Every day | NASAL | 5 refills | Status: DC

## 2023-03-05 MED ORDER — SYMBICORT 80-4.5 MCG/ACT IN AERO: 2.0000 | INHALATION_SPRAY | Freq: Two times a day (BID) | RESPIRATORY_TRACT | 0 refills | Status: DC

## 2023-03-05 NOTE — Progress Notes (Signed)
FOLLOW UP Date of Service/Encounter:  03/05/23  Subjective:  Christian Carroll (DOB: December 02, 1975) is a 47 y.o. male who [facility] in re-evaluation of the following: asthma, allergic rhinitis, allergic conjunctivitis, and reflux  History obtained from: chart review and patient.  For Review, LV was on 08/23/22  with Dr.Ireland Virrueta seen for routine follow-up. See below for summary of history and diagnostics.   Therapeutic plans/changes recommended: He reported preferring Xhance to [place], but it was pricey and insurance denied.  FEV1 101%. We switched him to Symbicort 80, 2 puffs twice daily, as advair did not seem to help much. Continued montelukast daily and albuterol as needed.  We tried again sending Timmothy Sours to a different speciality pharmacy but it was again denied.  We continued Xyzal and saline rinses, Opcon allergy drops and omeprazole consistently for 4 to 6 weeks and then as needed. ----------------------------------------------------- Pertinent History/Diagnostics:  Asthma:  Failed Flovent. Improved on ICS/LABA combo.  Cold air as a trigger. Allergic rhinitis:  Has reflux, partially controlled with diet and omeprazole PRN. Has concerns regarding side effects so avoids daily. environmental allergy skin testing was on 05/06/2019 and was positive to pollens, mold, and dust mites  --------------------------------------------------- Today presents for follow-up. He continues to have congestion. He is using flonase once in morning and one spray of xhance and repeats at night. He is supplementing with flonase due to cost.   He has not tried adding azelastine.  He did do AIT when he was younger, and would be interested in restarting this. His asthma he still will randomly act-up and he will feel wheezing and shortness of breath.  He does notice if he accidentally forgets to take his symbicort. He is needing albuterol around 2-3 times per week which he  typically will only use 1 puff prior to exercise or playing tennis.  Sometimes will go weeks with no rescue inhaler.  Cold air does affect his breathing.   He has not needed any steroids or antibiotics since last visit. He did have to get a rabies series because of a dog bite. He has one of the three shot series so far. He takes montelukast daily. Reflux-saw PCP and he suggested trying omeprazole every other day.  He is asymptomatic on this regimen.  All medications reviewed by clinical staff and updated in chart. No new pertinent medical or surgical history except as noted in HPI.  ROS: All others negative except as noted per HPI.   Objective:  BP 130/76   Pulse 74   Temp 99.8 F (37.7 C) (Temporal)   Wt 161 lb 4.8 oz (73.2 kg)   SpO2 97%   BMI 23.14 kg/m  Body mass index is 23.14 kg/m. Physical Exam: General Appearance:  Alert, cooperative, no distress, appears stated age  Head:  Normocephalic, without obvious abnormality, atraumatic  Eyes:  Conjunctiva clear, EOM's intact  Ears EACs normal bilaterally and normal TMs bilaterally  Nose: Nares normal, hypertrophic turbinates, normal mucosa, and no visible anterior polyps  Throat: Lips, tongue normal; teeth and gums normal, normal posterior oropharynx  Neck: Supple, symmetrical  Lungs:   clear to auscultation bilaterally, Respirations unlabored, no coughing  Heart:  regular rate and rhythm and no murmur, Appears well perfused  Extremities: No edema  Skin: Skin color, texture, turgor normal and no rashes or lesions on visualized portions of skin  Neurologic: No gross deficits   Labs:  Lab Orders  No laboratory test(s) ordered today  Spirometry:  Tracings reviewed. His effort: Good reproducible efforts. FVC: 6.21L FEV1: 4.52L, 109% predicted FEV1/FVC ratio: 0.73 Interpretation: Spirometry consistent with normal pattern.  Please see scanned spirometry results for details.  Assessment/Plan   Asthma-moderate  persistent, not at goal Symbicort 80 mcg, 2 puffs twice daily.   Use with spacer, wash mouth out after use. Continue montelukast 10 mg once a day to help prevent cough and wheeze Continue albuterol 2 puffs every 4 hours as needed for coughing, wheezing, tightness in chest or shortness of breath. Continue albuterol 2 puffs 5 to 15 minutes prior to exercise. If needing albuterol more than twice per week outside setting of exercise, let us know.  Allergic rhinitis-not at goal, plan to retest and start AIT Continue Xhance 1-2 sprays in each nostril twice a day for nasal congestion. Start Astelin/Astepro (azelastine) nasal spray 2 sprays each nostril up to twice daily for congestion Continue Xyzal 5 mg 1 tablet once a day as needed for runny nose and itching. Continue saline nasal rinses as needed for nasal symptoms. Use this before any medicated nasal sprays for best result Continue allergen avoidance measures directed toward pollens, mold, and dust mites as listed below  Allergic conjunctivitis-not at goal Continue Opcon-A using 1 drop 3 times a day as needed for itchy watery eyes. Some other over the counter eye drops include Pataday one drop in each eye once a day as needed for red, itchy eyes OR Zaditor one drop in each eye twice a day as needed for red itchy eyes.  Reflux-not at goal Continue dietary and lifestyle modifications as listed below Omeprazole 20 mg once a day-continue every other day.   Please let us know if this treatment plan is not working well for you.  Follow up: October 7th, 2024 at 1:30 PM for allergy testing 1-55, must be off all antihistamines for 3 days prior. (Zyrtec, etc and new nasal spray-astelin/astepro/azelastine). Okay to continue with Xhance/flonase and singulair/montelukast It was a pleasure seeing you again today!  Other: none  Tonny Bollman, MD  [facility]

## 2023-03-05 NOTE — Patient Instructions (Addendum)
Asthma Symbicort 80 mcg, 2 puffs twice daily.   Use with spacer, wash mouth out after use. Continue montelukast 10 mg once a day to help prevent cough and wheeze Continue albuterol 2 puffs every 4 hours as needed for coughing, wheezing, tightness in chest or shortness of breath. Continue albuterol 2 puffs 5 to 15 minutes prior to exercise. If needing albuterol more than twice per week outside setting of exercise, let us know.  Allergic rhinitis Continue Xhance 1-2 sprays in each nostril twice a day for nasal congestion. Start Astelin/Astepro (azelastine) nasal spray 2 sprays each nostril up to twice daily for congestion Continue Xyzal 5 mg 1 tablet once a day as needed for runny nose and itching. Continue saline nasal rinses as needed for nasal symptoms. Use this before any medicated nasal sprays for best result Continue allergen avoidance measures directed toward pollens, mold, and dust mites as listed below  Allergic conjunctivitis Continue Opcon-A using 1 drop 3 times a day as needed for itchy watery eyes. Some other over the counter eye drops include Pataday one drop in each eye once a day as needed for red, itchy eyes OR Zaditor one drop in each eye twice a day as needed for red itchy eyes.  Reflux Continue dietary and lifestyle modifications as listed below Omeprazole 20 mg once a day-continue every other day.   Please let us know if this treatment plan is not working well for you.  Follow up: October 7th, 2024 at 1:30 PM for allergy testing 1-55, must be off all antihistamines for 3 days prior. (Zyrtec, etc and new nasal spray-astelin/astepro/azelastine). Okay to continue with Xhance/flonase and singulair/montelukast It was a pleasure seeing you again today!  Reducing Pollen Exposure The American Academy of Allergy, Asthma and Immunology suggests the following steps to reduce your exposure to pollen during allergy seasons. Do not hang sheets or clothing out to dry; pollen may  collect on these items. Do not mow lawns or spend time around freshly cut grass; mowing stirs up pollen. Keep windows closed at night.  Keep car windows closed while driving. Minimize morning activities outdoors, a time when pollen counts are usually at their highest. Stay indoors as much as possible when pollen counts or humidity is high and on windy days when pollen tends to remain in the air longer. Use air conditioning when possible.  Many air conditioners have filters that trap the pollen spores. Use a HEPA room air filter to remove pollen form the indoor air you breathe.   Control of Mold Allergen Mold and fungi can grow on a variety of surfaces provided certain temperature and moisture conditions exist.  Outdoor molds grow on plants, decaying vegetation and soil.  The major outdoor mold, Alternaria and Cladosporium, are found in very high numbers during hot and dry conditions.  Generally, a late Summer - Fall peak is seen for common outdoor fungal spores.  Rain will temporarily lower outdoor mold spore count, but counts rise rapidly when the rainy period ends.  The most important indoor molds are Aspergillus and Penicillium.  Dark, humid and poorly ventilated basements are ideal sites for mold growth.  The next most common sites of mold growth are the bathroom and the kitchen.  Outdoor [employer] Use air conditioning and keep windows closed Avoid exposure to decaying vegetation. Avoid leaf raking. Avoid grain handling. Consider wearing a face mask if working in moldy areas.  Indoor Mold Control Maintain humidity below 50%. Clean washable surfaces with 5% bleach solution.  Remove sources e.g. Contaminated carpets.   Control of Dust Mite Allergen Dust mites play a major role in allergic asthma and rhinitis. They occur in environments with high humidity wherever human skin is found. Dust mites absorb humidity from the atmosphere (ie, they do not drink) and feed on organic matter  (including shed human and animal skin). Dust mites are a microscopic type of insect that you cannot see with the naked eye. High levels of dust mites have been detected from mattresses, pillows, carpets, upholstered furniture, bed covers, clothes, soft toys and any woven material. The principal allergen of the dust mite is found in its feces. A gram of dust may contain 1,000 mites and 250,000 fecal particles. Mite antigen is easily measured in the air during house cleaning activities. Dust mites do not bite and do not cause harm to humans, other than by triggering allergies/asthma.  Ways to decrease your exposure to dust mites in your home:  1. Encase mattresses, box springs and pillows with a mite-impermeable barrier or cover  2. Wash sheets, blankets and drapes weekly in hot water (130 F) with detergent and dry them in a dryer on the hot setting.  3. Have the room cleaned frequently with a vacuum cleaner and a damp dust-mop. For carpeting or rugs, vacuuming with a vacuum cleaner equipped with a high-efficiency particulate air (HEPA) filter. The dust mite allergic individual should not be in a room which is being cleaned and should wait 1 hour after cleaning before going into the room.  4. Do not sleep on upholstered furniture (eg, couches).  5. If possible removing carpeting, upholstered furniture and drapery from the home is ideal. Horizontal blinds should be eliminated in the rooms where the person spends the most time (bedroom, study, television room). Washable vinyl, roller-type shades are optimal.  6. Remove all non-washable stuffed toys from the bedroom. Wash stuffed toys weekly like sheets and blankets above.  7. Reduce indoor humidity to less than 50%. Inexpensive humidity monitors can be purchased at most hardware stores. Do not use a humidifier as can make the problem worse and are not recommended.  Gastroesophageal Reflux Induced Respiratory Disease and Laryngopharyngeal Reflux  (LPR): Gastroesophageal reflux disease (GERD) is a condition where the contents of the stomach reflux or back up into the esophagus or swallowing tube.  This can result in a variety of clinical symptoms including classic symptoms and atypical symptoms.  Classic symptoms of GERD include: heartburn, chest pain, acid taste in the mouth, and difficulty in swallowing.  Atypical symptoms of GERD include laryngopharyngeal reflux (LPR) and asthma.  LPR occurs when stomach reflux comes all the way up to the throat.  Clinical symptoms include hoarseness, raspy voice, laryngitis, throat clearing, postnasal drip, mucus stuck in the throat, a sensation of a lump in the throat, sore throat, and cough.  Most patients with LPR do not have classic symptoms of GERD.  Asthma can also be triggered by GERD.  The acid stomach fluid can stimulate nerve fibers in the esophagus which can cause an increase in bronchial muscle tone and narrowing of the airways.  Acid stomach contents may also reflux into the trachea and bronchi of the lungs where it can trigger an asthma attack.  Many people with GERD triggered asthma do not have classic symptoms of GERD.  Diagnosis of LPR and GERD induced asthma is frequently made from a typical history and response to medications.  It may take several months of medications to see a good response.  Occasionally, a 24-hour esophageal pH probe study must be performed.  Treatment of GERD/LPR includes:   Modification of diet and lifestyle Stop smoking Avoid overeating and lose weight Avoid acidic and fatty foods, chocolate, onions, garlic, peppermint Elevate the head of your bed 6 to 8 inches with blocks or wedge Medications Zantac, Pepcid, Axid, Tagamet Prilosec, Prevacid, Aciphex, Protonix, Nexium Surgery

## 2023-03-09 ENCOUNTER — Other Ambulatory Visit (HOSPITAL_COMMUNITY): Payer: Self-pay

## 2023-03-22 ENCOUNTER — Telehealth: Payer: Self-pay | Admitting: Internal Medicine

## 2023-03-22 NOTE — Telephone Encounter (Signed)
Patient has questions about the billing for the allergy testing.

## 2023-03-26 ENCOUNTER — Ambulatory Visit: Payer: 59 | Admitting: Internal Medicine

## 2023-04-02 ENCOUNTER — Encounter: Payer: Self-pay | Admitting: Internal Medicine

## 2023-04-03 ENCOUNTER — Other Ambulatory Visit: Payer: Self-pay | Admitting: Internal Medicine

## 2023-08-01 ENCOUNTER — Other Ambulatory Visit: Payer: Self-pay | Admitting: *Deleted

## 2023-08-01 ENCOUNTER — Encounter: Payer: Self-pay | Admitting: Internal Medicine

## 2023-08-01 MED ORDER — XHANCE 93 MCG/ACT NA EXHU: INHALANT_SUSPENSION | NASAL | 4 refills | Status: DC

## 2023-08-03 ENCOUNTER — Other Ambulatory Visit: Payer: Self-pay | Admitting: *Deleted

## 2023-08-03 ENCOUNTER — Other Ambulatory Visit: Payer: Self-pay

## 2023-08-03 MED ORDER — XHANCE 93 MCG/ACT NA EXHU: INHALANT_SUSPENSION | NASAL | 4 refills | Status: DC

## 2023-08-06 ENCOUNTER — Other Ambulatory Visit: Payer: Self-pay

## 2023-08-06 MED ORDER — XHANCE 93 MCG/ACT NA EXHU: INHALANT_SUSPENSION | NASAL | 4 refills | Status: DC

## 2023-08-08 ENCOUNTER — Other Ambulatory Visit (HOSPITAL_COMMUNITY): Payer: Self-pay

## 2023-08-08 ENCOUNTER — Telehealth: Payer: Self-pay

## 2023-08-08 ENCOUNTER — Telehealth: Payer: Self-pay | Admitting: Internal Medicine

## 2023-08-08 MED ORDER — XHANCE 93 MCG/ACT NA EXHU: INHALANT_SUSPENSION | NASAL | 4 refills | Status: DC

## 2023-08-08 NOTE — Telephone Encounter (Signed)
Pharmacy Patient Advocate Encounter  Received notification from [facility] that Prior Authorization for [facility] 93MCG/ACT exhaler suspension has been DENIED.  Full denial letter will be uploaded to the media tab. See denial reason below.  The requested medication and/or diagnosis are not a covered benefit and excluded from coverage inaccordance with the terms and conditions of your plan benefit. Therefore, the request has been administratively denied. If applicable, below are some alternative medications to consider:  FLUTICASONE PROPIONATE  PA #/Case ID/Reference #: [id number]

## 2023-08-08 NOTE — Telephone Encounter (Signed)
Pharmacy Patient Advocate Encounter   Received notification from Patient Advice Request messages that prior authorization for [facility] 93MCG/ACT exhaler suspension is required/requested.   Insurance verification completed.   The patient is insured through [facility] .   Per test claim: PA required; PA submitted to above mentioned insurance via CoverMyMeds Key/confirmation #/EOC [id number] Status is pending

## 2023-08-08 NOTE — Telephone Encounter (Signed)
Sent in rx of xhance to [facility] pt made aware

## 2023-08-08 NOTE — Telephone Encounter (Signed)
[facility] called stating this RX needs to be resent to them  instead of    [employer] 93 MCG/ACT Truddie Coco [[id number]]

## 2023-08-09 NOTE — Telephone Encounter (Signed)
PA request has been Denied. Encounter created for follow up. For additional info see Pharmacy Prior Auth telephone encounter from 08-08-2023.

## 2023-08-09 NOTE — Telephone Encounter (Signed)
Waiting on pt to let us know what pharmacy to send the flonase too

## 2023-08-09 NOTE — Telephone Encounter (Signed)
Flonase is okay. Thank you!

## 2023-08-09 NOTE — Telephone Encounter (Signed)
Pts insurance denied xhance prefers flonase

## 2023-08-10 NOTE — Telephone Encounter (Signed)
At this time pt does not want rx for flonase as otc is cheaper

## 2023-08-13 ENCOUNTER — Other Ambulatory Visit: Payer: Self-pay | Admitting: Family Medicine

## 2023-08-31 ENCOUNTER — Other Ambulatory Visit: Payer: Self-pay | Admitting: Internal Medicine

## 2023-09-08 ENCOUNTER — Other Ambulatory Visit: Payer: Self-pay | Admitting: Internal Medicine

## 2023-09-10 ENCOUNTER — Encounter: Payer: Self-pay | Admitting: Internal Medicine

## 2023-09-10 ENCOUNTER — Other Ambulatory Visit: Payer: Self-pay

## 2023-09-10 ENCOUNTER — Ambulatory Visit: Admitting: Internal Medicine

## 2023-09-10 VITALS — BP 124/80 | HR 70 | Temp 99.0°F | Wt 173.1 lb

## 2023-09-10 DIAGNOSIS — H1013 Acute atopic conjunctivitis, bilateral: Secondary | ICD-10-CM

## 2023-09-10 DIAGNOSIS — J4540 Moderate persistent asthma, uncomplicated: Secondary | ICD-10-CM

## 2023-09-10 DIAGNOSIS — J302 Other seasonal allergic rhinitis: Secondary | ICD-10-CM

## 2023-09-10 DIAGNOSIS — K219 Gastro-esophageal reflux disease without esophagitis: Secondary | ICD-10-CM | POA: Diagnosis not present

## 2023-09-10 DIAGNOSIS — J3089 Other allergic rhinitis: Secondary | ICD-10-CM

## 2023-09-10 MED ORDER — XHANCE 93 MCG/ACT NA EXHU: 2.0000 | INHALANT_SUSPENSION | Freq: Two times a day (BID) | NASAL | Status: DC

## 2023-09-10 NOTE — Progress Notes (Signed)
 Medication Samples have been provided to the patient.  Drug name: Christian Carroll       Strength: 93        Qty: 1  LOT: [id number]  Exp.Date: 06/2024  Dosing instructions: 1-2 spray each nostril twice a day  The patient has been instructed regarding the correct time, dose, and frequency of taking this medication, including desired effects and most common side effects.   Christian Carroll 1:50 PM 09/10/2023

## 2023-09-10 NOTE — Progress Notes (Signed)
 FOLLOW UP Date of Service/Encounter:  09/10/23  Subjective:  Christian Carroll (DOB: [birth year]) is a 48 y.o. male who [facility] in re-evaluation of the following: Asthma, allergic rhinitis, acid reflux History obtained from: chart review and patient.  For Review, LV was on 03/05/2023 with Dr.Iyauna Sing seen for routine follow-up. See below for summary of history and diagnostics.   Therapeutic plans/changes recommended: He was interested in AIT with plans to return for allergy testing.  Using Xhance 1 spray twice a day and Flonase 1 spray twice a day to cut down on cost of Xhance which has been most effective for him. ----------------------------------------------------- Pertinent History/Diagnostics:  Asthma:  Failed Flovent. Improved on ICS/LABA combo.  Cold air as a trigger. Allergic rhinitis:  Has reflux, partially controlled with diet and omeprazole PRN. Has concerns regarding side effects so avoids daily. environmental allergy skin testing was on 05/06/2019 and was positive to pollens, mold, and dust mites  --------------------------------------------------- Today presents for follow-up. Discussed the use of AI scribe software for clinical note transcription with the patient, who gave verbal consent to proceed.  History of Present Illness   Christian Carroll is a 48 year old male with asthma and allergic rhinitis who presents for follow-up on his respiratory symptoms and medication management.  He has experienced significant improvement in his breathing with the use of Xhance, which he administers once in the morning and once in the evening. Previously, when not using Xhance, he required albuterol more frequently. Currently, he uses Symbicort, two puffs twice daily, and has not needed albuterol recently except as a precaution before jogging in cold weather. High cardio activities are easier with Xhance, and without it, he notices a difference in  his breathing. No recent emergency room visits or need for steroids for his breathing issues.  His current medication regimen includes montelukast daily and azelastine nasal spray once daily. He uses Flonase and Xhance in the morning and evening and takes Xyzal daily unless advised otherwise.   Regarding his reflux, he takes medication every other day and is mindful of his diet. He avoids spicy foods, garlic, and limits caffeine and chocolate intake. He believes asthma contributes to his reflux symptoms.  He recalls a previous incident involving a dog bite that required a series of rabies shots, which were covered by insurance after initial concerns about cost. He has had discussions with his insurance regarding coverage for allergy injections and is aware of the need to understand his deductible and potential costs.  He has a family history of hypertension on his father's side, with many family members requiring medication by their fifties. He is monitoring his blood pressure and has an upcoming appointment with his general practitioner to discuss this further.   He is ready to pursue allergy injections.     All medications reviewed by clinical staff and updated in chart. No new pertinent medical or surgical history except as noted in HPI.  ROS: All others negative except as noted per HPI.   Objective:  BP 124/80 (BP Location: Left Arm, Patient Position: Sitting, Cuff Size: Normal)   Pulse 70   Temp 99 F (37.2 C) (Temporal)   Wt 173 lb 1.6 oz (78.5 kg)   SpO2 98%   BMI 24.84 kg/m  Body mass index is 24.84 kg/m. Physical Exam: General Appearance:  Alert, cooperative, no distress, appears stated age  Head:  Normocephalic, without obvious abnormality, atraumatic  Eyes:  Conjunctiva clear, EOM's intact  Ears EACs normal bilaterally and normal TMs bilaterally  Nose: Nares normal, hypertrophic turbinates, normal mucosa, no visible anterior polyps, and septum midline  Throat: Lips,  tongue normal; teeth and gums normal, normal posterior oropharynx  Neck: Supple, symmetrical  Lungs:   clear to auscultation bilaterally, Respirations unlabored, no coughing  Heart:  regular rate and rhythm and no murmur, Appears well perfused  Extremities: No edema  Skin: Skin color, texture, turgor normal and no rashes or lesions on visualized portions of skin  Neurologic: No gross deficits   Labs:  Lab Orders  No laboratory test(s) ordered today    Spirometry:  Tracings reviewed. His effort: Good reproducible efforts. FVC: 6.20L FEV1: 4.38L, 105% predicted FEV1/FVC ratio: 0.71 Interpretation: Spirometry consistent with normal pattern.  Please see scanned spirometry results for details.  Assessment/Plan   Asthma-at goal Symbicort 80 mcg, 2 puffs twice daily.   Use with spacer, wash mouth out after use. Continue montelukast 10 mg once a day to help prevent cough and wheeze Continue albuterol 2 puffs every 4 hours as needed for coughing, wheezing, tightness in chest or shortness of breath. Continue albuterol 2 puffs 5 to 15 minutes prior to exercise. If needing albuterol more than twice per week outside setting of exercise, let us know.  Allergic rhinitis-controlled on medications, interested in AIT to reduce lifetime need for medications Continue Xhance 1-2 sprays in each nostril twice a day for nasal congestion. Continue Astelin/Astepro (azelastine) nasal spray 2 sprays each nostril up to twice daily for congestion Continue Xyzal 5 mg 1 tablet once a day as needed for runny nose and itching. Continue saline nasal rinses as needed for nasal symptoms. Use this before any medicated nasal sprays for best result Continue allergen avoidance measures directed toward pollens, mold, and dust mites as listed below Return to update allergy testing and start injections.  Allergic conjunctivitis-at goal Continue Opcon-A using 1 drop 3 times a day as needed for itchy watery eyes. Some  other over the counter eye drops include Pataday one drop in each eye once a day as needed for red, itchy eyes OR Zaditor one drop in each eye twice a day as needed for red itchy eyes.  Reflux-at goal Continue dietary and lifestyle modifications as listed below Omeprazole 20 mg once a day-continue every other day.   Please let us know if this treatment plan is not working well for you.  Follow up: April 7th at 10:30 AM for allergy testing 1-55, must be off all antihistamines for 3 days prior. (Xyzal, etc and -astelin/astepro/azelastine).  Okay to continue with Xhance/flonase and singulair/montelukast as well as Symbicort and albuterol. It was a pleasure seeing you again today!  Other: samples provided of: Justice Britain, MD  [facility]

## 2023-09-10 NOTE — Patient Instructions (Addendum)
 Asthma Symbicort 80 mcg, 2 puffs twice daily.   Use with spacer, wash mouth out after use. Continue montelukast 10 mg once a day to help prevent cough and wheeze Continue albuterol 2 puffs every 4 hours as needed for coughing, wheezing, tightness in chest or shortness of breath. Continue albuterol 2 puffs 5 to 15 minutes prior to exercise. If needing albuterol more than twice per week outside setting of exercise, let us know.  Allergic rhinitis Continue Xhance 1-2 sprays in each nostril twice a day for nasal congestion. Continue Astelin/Astepro (azelastine) nasal spray 2 sprays each nostril up to twice daily for congestion Continue Xyzal 5 mg 1 tablet once a day as needed for runny nose and itching. Continue saline nasal rinses as needed for nasal symptoms. Use this before any medicated nasal sprays for best result Continue allergen avoidance measures directed toward pollens, mold, and dust mites as listed below Return to update allergy testing and start injections.  Allergic conjunctivitis Continue Opcon-A using 1 drop 3 times a day as needed for itchy watery eyes. Some other over the counter eye drops include Pataday one drop in each eye once a day as needed for red, itchy eyes OR Zaditor one drop in each eye twice a day as needed for red itchy eyes.  Reflux Continue dietary and lifestyle modifications as listed below Omeprazole 20 mg once a day-continue every other day.   Please let us know if this treatment plan is not working well for you.  Follow up: April 7th at 10:30 AM for allergy testing 1-55, must be off all antihistamines for 3 days prior. (Xyzal, etc and -astelin/astepro/azelastine).  Okay to continue with Xhance/flonase and singulair/montelukast as well as Symbicort and albuterol. It was a pleasure seeing you again today!  Reducing Pollen Exposure The American Academy of Allergy, Asthma and Immunology suggests the following steps to reduce your exposure to pollen during  allergy seasons. Do not hang sheets or clothing out to dry; pollen may collect on these items. Do not mow lawns or spend time around freshly cut grass; mowing stirs up pollen. Keep windows closed at night.  Keep car windows closed while driving. Minimize morning activities outdoors, a time when pollen counts are usually at their highest. Stay indoors as much as possible when pollen counts or humidity is high and on windy days when pollen tends to remain in the air longer. Use air conditioning when possible.  Many air conditioners have filters that trap the pollen spores. Use a HEPA room air filter to remove pollen form the indoor air you breathe.   Control of Mold Allergen Mold and fungi can grow on a variety of surfaces provided certain temperature and moisture conditions exist.  Outdoor molds grow on plants, decaying vegetation and soil.  The major outdoor mold, Alternaria and Cladosporium, are found in very high numbers during hot and dry conditions.  Generally, a late Summer - Fall peak is seen for common outdoor fungal spores.  Rain will temporarily lower outdoor mold spore count, but counts rise rapidly when the rainy period ends.  The most important indoor molds are Aspergillus and Penicillium.  Dark, humid and poorly ventilated basements are ideal sites for mold growth.  The next most common sites of mold growth are the bathroom and the kitchen.  Outdoor [employer] Use air conditioning and keep windows closed Avoid exposure to decaying vegetation. Avoid leaf raking. Avoid grain handling. Consider wearing a face mask if working in moldy areas.  Indoor Mold  Control Maintain humidity below 50%. Clean washable surfaces with 5% bleach solution. Remove sources e.g. Contaminated carpets.   Control of Dust Mite Allergen Dust mites play a major role in allergic asthma and rhinitis. They occur in environments with high humidity wherever human skin is found. Dust mites absorb humidity from  the atmosphere (ie, they do not drink) and feed on organic matter (including shed human and animal skin). Dust mites are a microscopic type of insect that you cannot see with the naked eye. High levels of dust mites have been detected from mattresses, pillows, carpets, upholstered furniture, bed covers, clothes, soft toys and any woven material. The principal allergen of the dust mite is found in its feces. A gram of dust may contain 1,000 mites and 250,000 fecal particles. Mite antigen is easily measured in the air during house cleaning activities. Dust mites do not bite and do not cause harm to humans, other than by triggering allergies/asthma.  Ways to decrease your exposure to dust mites in your home:  1. Encase mattresses, box springs and pillows with a mite-impermeable barrier or cover  2. Wash sheets, blankets and drapes weekly in hot water (130 F) with detergent and dry them in a dryer on the hot setting.  3. Have the room cleaned frequently with a vacuum cleaner and a damp dust-mop. For carpeting or rugs, vacuuming with a vacuum cleaner equipped with a high-efficiency particulate air (HEPA) filter. The dust mite allergic individual should not be in a room which is being cleaned and should wait 1 hour after cleaning before going into the room.  4. Do not sleep on upholstered furniture (eg, couches).  5. If possible removing carpeting, upholstered furniture and drapery from the home is ideal. Horizontal blinds should be eliminated in the rooms where the person spends the most time (bedroom, study, television room). Washable vinyl, roller-type shades are optimal.  6. Remove all non-washable stuffed toys from the bedroom. Wash stuffed toys weekly like sheets and blankets above.  7. Reduce indoor humidity to less than 50%. Inexpensive humidity monitors can be purchased at most hardware stores. Do not use a humidifier as can make the problem worse and are not recommended.  Gastroesophageal  Reflux Induced Respiratory Disease and Laryngopharyngeal Reflux (LPR): Gastroesophageal reflux disease (GERD) is a condition where the contents of the stomach reflux or back up into the esophagus or swallowing tube.  This can result in a variety of clinical symptoms including classic symptoms and atypical symptoms.  Classic symptoms of GERD include: heartburn, chest pain, acid taste in the mouth, and difficulty in swallowing.  Atypical symptoms of GERD include laryngopharyngeal reflux (LPR) and asthma.  LPR occurs when stomach reflux comes all the way up to the throat.  Clinical symptoms include hoarseness, raspy voice, laryngitis, throat clearing, postnasal drip, mucus stuck in the throat, a sensation of a lump in the throat, sore throat, and cough.  Most patients with LPR do not have classic symptoms of GERD.  Asthma can also be triggered by GERD.  The acid stomach fluid can stimulate nerve fibers in the esophagus which can cause an increase in bronchial muscle tone and narrowing of the airways.  Acid stomach contents may also reflux into the trachea and bronchi of the lungs where it can trigger an asthma attack.  Many people with GERD triggered asthma do not have classic symptoms of GERD.  Diagnosis of LPR and GERD induced asthma is frequently made from a typical history and response to medications.  It may take several months of medications to see a good response.  Occasionally, a 24-hour esophageal pH probe study must be performed.  Treatment of GERD/LPR includes:   Modification of diet and lifestyle Stop smoking Avoid overeating and lose weight Avoid acidic and fatty foods, chocolate, onions, garlic, peppermint Elevate the head of your bed 6 to 8 inches with blocks or wedge Medications Zantac, Pepcid, Axid, Tagamet Prilosec, Prevacid, Aciphex, Protonix, Nexium Surgery

## 2023-09-24 ENCOUNTER — Telehealth: Payer: Self-pay | Admitting: Internal Medicine

## 2023-09-24 ENCOUNTER — Encounter: Payer: Self-pay | Admitting: Internal Medicine

## 2023-09-24 ENCOUNTER — Ambulatory Visit: Admitting: Internal Medicine

## 2023-09-24 DIAGNOSIS — J3089 Other allergic rhinitis: Secondary | ICD-10-CM | POA: Diagnosis not present

## 2023-09-24 DIAGNOSIS — J302 Other seasonal allergic rhinitis: Secondary | ICD-10-CM | POA: Diagnosis not present

## 2023-09-24 MED ORDER — EPINEPHRINE 0.3 MG/0.3ML IJ SOAJ: 0.3000 mg | INTRAMUSCULAR | 2 refills | Status: AC | PRN

## 2023-09-24 NOTE — Patient Instructions (Signed)
 Asthma Symbicort 80 mcg, 2 puffs twice daily.   Use with spacer, wash mouth out after use. Continue montelukast 10 mg once a day to help prevent cough and wheeze Continue albuterol 2 puffs every 4 hours as needed for coughing, wheezing, tightness in chest or shortness of breath. Continue albuterol 2 puffs 5 to 15 minutes prior to exercise. If needing albuterol more than twice per week outside setting of exercise, let us know.  Allergic rhinitis Continue Xhance 1-2 sprays in each nostril twice a day for nasal congestion. Continue Astelin/Astepro (azelastine) nasal spray 2 sprays each nostril up to twice daily for congestion Continue Xyzal 5 mg 1 tablet once a day as needed for runny nose and itching. Continue saline nasal rinses as needed for nasal symptoms. Use this before any medicated nasal sprays for best result Continue allergen avoidance measures directed toward pollens, mold, and dust mites as listed below Allergy testing today positive to grass pollen, weed pollen, tree pollen, indoor and outdoor molds and dust mite.  Allergen avoidance. Patient start allergy injections during traditional buildup.  Consent signed today.  Injection prescription written.  Allergic conjunctivitis Continue Opcon-A using 1 drop 3 times a day as needed for itchy watery eyes. Some other over the counter eye drops include Pataday one drop in each eye once a day as needed for red, itchy eyes OR Zaditor one drop in each eye twice a day as needed for red itchy eyes.  Reflux Continue dietary and lifestyle modifications as listed below Omeprazole 20 mg once a day-continue every other day.   Please let us know if this treatment plan is not working well for you.  Follow up:4-6 months, sooner if needed.  Okay to continue with Xhance/flonase and singulair/montelukast as well as Symbicort and albuterol. It was a pleasure seeing you again today!  Reducing Pollen Exposure The American Academy of Allergy, Asthma and  Immunology suggests the following steps to reduce your exposure to pollen during allergy seasons. Do not hang sheets or clothing out to dry; pollen may collect on these items. Do not mow lawns or spend time around freshly cut grass; mowing stirs up pollen. Keep windows closed at night.  Keep car windows closed while driving. Minimize morning activities outdoors, a time when pollen counts are usually at their highest. Stay indoors as much as possible when pollen counts or humidity is high and on windy days when pollen tends to remain in the air longer. Use air conditioning when possible.  Many air conditioners have filters that trap the pollen spores. Use a HEPA room air filter to remove pollen form the indoor air you breathe.   Control of Mold Allergen Mold and fungi can grow on a variety of surfaces provided certain temperature and moisture conditions exist.  Outdoor molds grow on plants, decaying vegetation and soil.  The major outdoor mold, Alternaria and Cladosporium, are found in very high numbers during hot and dry conditions.  Generally, a late Summer - Fall peak is seen for common outdoor fungal spores.  Rain will temporarily lower outdoor mold spore count, but counts rise rapidly when the rainy period ends.  The most important indoor molds are Aspergillus and Penicillium.  Dark, humid and poorly ventilated basements are ideal sites for mold growth.  The next most common sites of mold growth are the bathroom and the kitchen.  Outdoor [employer] Use air conditioning and keep windows closed Avoid exposure to decaying vegetation. Avoid leaf raking. Avoid grain handling. Consider wearing a  face mask if working in moldy areas.  Indoor Mold Control Maintain humidity below 50%. Clean washable surfaces with 5% bleach solution. Remove sources e.g. Contaminated carpets.   Control of Dust Mite Allergen Dust mites play a major role in allergic asthma and rhinitis. They occur in environments  with high humidity wherever human skin is found. Dust mites absorb humidity from the atmosphere (ie, they do not drink) and feed on organic matter (including shed human and animal skin). Dust mites are a microscopic type of insect that you cannot see with the naked eye. High levels of dust mites have been detected from mattresses, pillows, carpets, upholstered furniture, bed covers, clothes, soft toys and any woven material. The principal allergen of the dust mite is found in its feces. A gram of dust may contain 1,000 mites and 250,000 fecal particles. Mite antigen is easily measured in the air during house cleaning activities. Dust mites do not bite and do not cause harm to humans, other than by triggering allergies/asthma.  Ways to decrease your exposure to dust mites in your home:  1. Encase mattresses, box springs and pillows with a mite-impermeable barrier or cover  2. Wash sheets, blankets and drapes weekly in hot water (130 F) with detergent and dry them in a dryer on the hot setting.  3. Have the room cleaned frequently with a vacuum cleaner and a damp dust-mop. For carpeting or rugs, vacuuming with a vacuum cleaner equipped with a high-efficiency particulate air (HEPA) filter. The dust mite allergic individual should not be in a room which is being cleaned and should wait 1 hour after cleaning before going into the room.  4. Do not sleep on upholstered furniture (eg, couches).  5. If possible removing carpeting, upholstered furniture and drapery from the home is ideal. Horizontal blinds should be eliminated in the rooms where the person spends the most time (bedroom, study, television room). Washable vinyl, roller-type shades are optimal.  6. Remove all non-washable stuffed toys from the bedroom. Wash stuffed toys weekly like sheets and blankets above.  7. Reduce indoor humidity to less than 50%. Inexpensive humidity monitors can be purchased at most hardware stores. Do not use a  humidifier as can make the problem worse and are not recommended.  Gastroesophageal Reflux Induced Respiratory Disease and Laryngopharyngeal Reflux (LPR): Gastroesophageal reflux disease (GERD) is a condition where the contents of the stomach reflux or back up into the esophagus or swallowing tube.  This can result in a variety of clinical symptoms including classic symptoms and atypical symptoms.  Classic symptoms of GERD include: heartburn, chest pain, acid taste in the mouth, and difficulty in swallowing.  Atypical symptoms of GERD include laryngopharyngeal reflux (LPR) and asthma.  LPR occurs when stomach reflux comes all the way up to the throat.  Clinical symptoms include hoarseness, raspy voice, laryngitis, throat clearing, postnasal drip, mucus stuck in the throat, a sensation of a lump in the throat, sore throat, and cough.  Most patients with LPR do not have classic symptoms of GERD.  Asthma can also be triggered by GERD.  The acid stomach fluid can stimulate nerve fibers in the esophagus which can cause an increase in bronchial muscle tone and narrowing of the airways.  Acid stomach contents may also reflux into the trachea and bronchi of the lungs where it can trigger an asthma attack.  Many people with GERD triggered asthma do not have classic symptoms of GERD.  Diagnosis of LPR and GERD induced asthma is frequently  made from a typical history and response to medications.  It may take several months of medications to see a good response.  Occasionally, a 24-hour esophageal pH probe study must be performed.  Treatment of GERD/LPR includes:   Modification of diet and lifestyle Stop smoking Avoid overeating and lose weight Avoid acidic and fatty foods, chocolate, onions, garlic, peppermint Elevate the head of your bed 6 to 8 inches with blocks or wedge Medications Zantac, Pepcid, Axid, Tagamet Prilosec, Prevacid, Aciphex, Protonix, Nexium Surgery

## 2023-09-24 NOTE — Progress Notes (Signed)
 Date of Service/Encounter:  09/24/23  Allergy testing appointment   Previous visit on 09/10/23, seen for seasonal and perennial allergic rhinitis and asthma.  Please see that note for additional details.  Today reports for allergy diagnostic testing:    DIAGNOSTICS:  Skin Testing: Environmental allergy panel. Adequate positive and negative controls. Results discussed with patient/family.  Airborne Adult Perc - 09/24/23 1046     Time Antigen Placed 1046    Allergen Manufacturer Waynette Buttery    Location Back    Number of Test 55    Panel 1 Select    1. Control-Buffer 50% Glycerol Negative    2. Control-Histamine 3+    3. Bahia 2+    4. [place] 3+    5. Johnson 3+    6. [place] Blue 3+    7. Meadow Fescue 3+    8. Perennial Rye 3+    9. Timothy 3+    10. Ragweed Mix Negative    11. Cocklebur Negative    12. Plantain,  English 3+    13. Baccharis 3+    14. Dog Fennel Negative    15. Russian Thistle Negative    16. Lamb's Quarters Negative    17. Sheep Sorrell 3+    18. Rough Pigweed 3+    19. Marsh Elder, Rough 3+    20. Mugwort, Common 3+    21. Box, Elder 3+    22. Cedar, red 3+    23. Sweet Gum Negative    24. Pecan Pollen 2+    25. Pine Mix Negative    26. Walnut, Black Pollen 2+    27. Red Mulberry 2+    28. Ash Mix 3+    29. Birch Mix 2+    30. Beech American 3+    31. Cottonwood, Eastern 3+    32. Hickory, White 3+    33. Maple Mix Negative    34. Oak, [place] Mix 3+    35. Sycamore Eastern 3+    36. Alternaria Alternata 4+    37. Cladosporium Herbarum Negative    38. Aspergillus Mix Negative    39. Penicillium Mix 3+    40. Bipolaris Sorokiniana (Helminthosporium) 4+    41. Drechslera Spicifera (Curvularia) 4+    42. Mucor Plumbeus Negative    43. Fusarium Moniliforme 4+    44. Aureobasidium Pullulans (pullulara) Negative    45. Rhizopus Oryzae Negative    46. Botrytis Cinera 4+    47. Epicoccum Nigrum 4+    48. Phoma Betae 3+    49. Dust Mite Mix  Negative    50. Cat Hair 10,000 BAU/ml Negative    51.  Dog Epithelia Negative    52. Mixed Feathers Negative    53. Horse Epithelia Negative    54. Cockroach, German Negative    55. Tobacco Leaf Negative             Intradermal - 09/24/23 1106     Time Antigen Placed 1106    Allergen Manufacturer Waynette Buttery    Location Arm    Number of Test 6    Control Negative    Ragweed Mix 2+    Mite Mix 2+    Cat Negative    Dog Negative    Cockroach Negative             Allergy testing results were read and interpreted by myself, documented by clinical staff.  Patient provided with copy of allergy testing along with avoidance measures when  indicated.   Tonny Bollman, MD  [facility]

## 2023-09-24 NOTE — Telephone Encounter (Signed)
 Patient called stating insurance does cover allergy injections. Patient's new start injection appointment has been scheduled for 4/30 at 11.

## 2023-09-24 NOTE — Telephone Encounter (Signed)
 Awesome. His script is ready!

## 2023-09-25 DIAGNOSIS — J301 Allergic rhinitis due to pollen: Secondary | ICD-10-CM | POA: Diagnosis not present

## 2023-09-25 NOTE — Progress Notes (Signed)
 Aeroallergen Immunotherapy  Ordering Provider: Dr. Tonny Bollman  Patient Details Name: Pedro Whiters MRN: [id number] Date of Birth: 12/30/1975  Order 1 of 2  Vial Label: G/W/T  0.3 ml (Volume)  BAU Concentration -- 7 Grass Mix* 100,000 ([address] [place], [place], [place], [place] Rye, RedTop, Sweet Vernal, Timothy) 0.2 ml (Volume)  1:20 Concentration -- Bahia 0.3 ml (Volume)  BAU Concentration -- [place] 10,000 0.2 ml (Volume)  1:20 Concentration -- Johnson 0.3 ml (Volume)  1:20 Concentration -- Ragweed Mix 0.2 ml (Volume)  1:10 Concentration -- Plantain English 0.2 ml (Volume)  1:40 Concentration -- Baccharis 0.5 ml (Volume)  1:20 Concentration -- Weed Mix* 0.5 ml (Volume)  1:20 Concentration -- Eastern 10 Tree Mix (also Sweet Gum) 0.2 ml (Volume)  1:20 Concentration -- Box Elder 0.2 ml (Volume)  1:10 Concentration -- Cedar, red 0.2 ml (Volume)  1:10 Concentration -- Pecan Pollen 0.2 ml (Volume)  1:20 Concentration -- Red Mulberry 0.2 ml (Volume)  1:20 Concentration -- Walnut, Black Pollen   3.7  ml Extract Subtotal 1.3  ml Diluent 5.0  ml Maintenance Total  Schedule:  B Blue Vial (1:100,000): Schedule B (6 doses) Yellow Vial (1:10,000): Schedule B (6 doses) Green Vial (1:1,000): Schedule B (6 doses) Red Vial (1:100): Schedule A (10 doses)  Special Instructions: Schedule B until red and then A. After completion of the first Red Vial, please space to every two weeks. After completion of the second Red Vial, please space to every 4 weeks. Ok to up dose new vials at 0.9mL --> 0.3 mL --> 0.5 mL. Ok to come twice weekly, if desired, as long as there is 48 hours between injections except in red vials (only once weekly).

## 2023-09-25 NOTE — Progress Notes (Signed)
 VIAL SET ONE G-W-T MADE 09-25-23. EXP 09-24-24

## 2023-09-25 NOTE — Progress Notes (Signed)
 Aeroallergen Immunotherapy   Ordering Provider: Dr. Tonny Bollman   Patient Details  Name: Christian Carroll  MRN: [id number]  Date of Birth: 1975/12/29   Order 2 of 2   Vial Label: M/DM   0.2 ml (Volume)  1:20 Concentration -- Alternaria alternata  0.2 ml (Volume)  1:10 Concentration -- Penicillium mix  0.2 ml (Volume)  1:20 Concentration -- Bipolaris sorokiniana  0.2 ml (Volume)  1:20 Concentration -- Drechslera spicifera  0.2 ml (Volume)  1:10 Concentration -- Fusarium moniliforme  0.2 ml (Volume)  1:40 Concentration -- Botrytis cinerea  0.2 ml (Volume)  1:40 Concentration -- Epicoccum nigrum  0.2 ml (Volume)  1:40 Concentration -- Phoma betae  0.5 ml (Volume)   AU Concentration -- Mite Mix (DF 5,000 & DP 5,000)    2.1  ml Extract Subtotal  2.9  ml Diluent  5.0  ml Maintenance Total   Schedule:  B  Blue Vial (1:100,000): Schedule B (6 doses)  Yellow Vial (1:10,000): Schedule B (6 doses)  Green Vial (1:1,000): Schedule B (6 doses)  Red Vial (1:100): Schedule A (10 doses)   Special Instructions: Schedule B until red and then A. After completion of the first Red Vial, please space to every two weeks. After completion of the second Red Vial, please space to every 4 weeks. Ok to up dose new vials at 0.80mL --> 0.3 mL --> 0.5 mL. Ok to come twice weekly, if desired, as long as there is 48 hours between injections except in red vials (only once weekly).

## 2023-10-01 DIAGNOSIS — J3089 Other allergic rhinitis: Secondary | ICD-10-CM | POA: Diagnosis not present

## 2023-10-01 NOTE — Progress Notes (Signed)
 VIAL SET TWO M-DM MADE 09-26-23.  EXP 09-25-24

## 2023-10-07 ENCOUNTER — Other Ambulatory Visit: Payer: Self-pay | Admitting: Internal Medicine

## 2023-10-17 ENCOUNTER — Ambulatory Visit (INDEPENDENT_AMBULATORY_CARE_PROVIDER_SITE_OTHER)

## 2023-10-17 DIAGNOSIS — J309 Allergic rhinitis, unspecified: Secondary | ICD-10-CM

## 2023-10-17 NOTE — Progress Notes (Signed)
 Immunotherapy   Patient Details  Name: Christian Carroll MRN: [id number] Date of Birth: Mar 22, 1976  10/17/2023  Christian Carroll started injections for G-W-T and M-DM. Patient received 0.05 ml of both blue vials with an exp of 09/24/2024. Patient waited 30 minutes with no problems.  Following schedule: B  Frequency:2 times per week Epi-Pen:Epi-Pen Available  Consent signed and patient instructions given.   Denton Flakes 10/17/2023, 11:05 AM

## 2023-10-24 ENCOUNTER — Ambulatory Visit (INDEPENDENT_AMBULATORY_CARE_PROVIDER_SITE_OTHER)

## 2023-10-24 DIAGNOSIS — J309 Allergic rhinitis, unspecified: Secondary | ICD-10-CM | POA: Diagnosis not present

## 2023-10-31 ENCOUNTER — Ambulatory Visit (INDEPENDENT_AMBULATORY_CARE_PROVIDER_SITE_OTHER)

## 2023-10-31 DIAGNOSIS — J309 Allergic rhinitis, unspecified: Secondary | ICD-10-CM | POA: Diagnosis not present

## 2023-11-07 ENCOUNTER — Ambulatory Visit (INDEPENDENT_AMBULATORY_CARE_PROVIDER_SITE_OTHER)

## 2023-11-07 DIAGNOSIS — J309 Allergic rhinitis, unspecified: Secondary | ICD-10-CM

## 2023-11-14 ENCOUNTER — Ambulatory Visit (INDEPENDENT_AMBULATORY_CARE_PROVIDER_SITE_OTHER)

## 2023-11-14 DIAGNOSIS — J309 Allergic rhinitis, unspecified: Secondary | ICD-10-CM | POA: Diagnosis not present

## 2023-11-21 ENCOUNTER — Ambulatory Visit (INDEPENDENT_AMBULATORY_CARE_PROVIDER_SITE_OTHER)

## 2023-11-21 DIAGNOSIS — J309 Allergic rhinitis, unspecified: Secondary | ICD-10-CM | POA: Diagnosis not present

## 2023-11-26 ENCOUNTER — Ambulatory Visit (INDEPENDENT_AMBULATORY_CARE_PROVIDER_SITE_OTHER)

## 2023-11-26 DIAGNOSIS — J309 Allergic rhinitis, unspecified: Secondary | ICD-10-CM

## 2023-11-27 ENCOUNTER — Encounter: Payer: Self-pay | Admitting: Internal Medicine

## 2023-11-27 NOTE — Telephone Encounter (Signed)
 Patient has picked up Xhance  Sample.

## 2023-12-12 ENCOUNTER — Ambulatory Visit (INDEPENDENT_AMBULATORY_CARE_PROVIDER_SITE_OTHER)

## 2023-12-12 DIAGNOSIS — J309 Allergic rhinitis, unspecified: Secondary | ICD-10-CM

## 2023-12-17 ENCOUNTER — Other Ambulatory Visit: Payer: Self-pay | Admitting: Internal Medicine

## 2023-12-24 ENCOUNTER — Encounter: Payer: Self-pay | Admitting: Family Medicine

## 2023-12-24 ENCOUNTER — Ambulatory Visit: Admitting: Family Medicine

## 2023-12-24 ENCOUNTER — Other Ambulatory Visit: Payer: Self-pay

## 2023-12-24 VITALS — BP 132/88 | HR 67 | Temp 98.2°F | Resp 18

## 2023-12-24 DIAGNOSIS — J3089 Other allergic rhinitis: Secondary | ICD-10-CM

## 2023-12-24 DIAGNOSIS — J302 Other seasonal allergic rhinitis: Secondary | ICD-10-CM

## 2023-12-24 DIAGNOSIS — J4540 Moderate persistent asthma, uncomplicated: Secondary | ICD-10-CM

## 2023-12-24 DIAGNOSIS — H1013 Acute atopic conjunctivitis, bilateral: Secondary | ICD-10-CM

## 2023-12-24 DIAGNOSIS — K219 Gastro-esophageal reflux disease without esophagitis: Secondary | ICD-10-CM | POA: Diagnosis not present

## 2023-12-24 DIAGNOSIS — L500 Allergic urticaria: Secondary | ICD-10-CM

## 2023-12-24 MED ORDER — CETIRIZINE HCL 10 MG PO TABS: 10.0000 mg | ORAL_TABLET | Freq: Every day | ORAL | 5 refills | Status: AC

## 2023-12-24 NOTE — Patient Instructions (Addendum)
 Asthma Continue Symbicort  80-2 puffs twice a day with a spacer to prevent cough or wheeze.  Continue montelukast  10 mg once a day to help prevent cough and wheeze Continue albuterol  2 puffs every 4 hours as needed for coughing, wheezing, tightness in chest or shortness of breath. Continue albuterol  2 puffs 5 to 15 minutes prior to exercise.  Allergic rhinitis Continue Xhance  1-2 sprays in each nostril twice a day for nasal congestion. Continue to follow up with your eye doctor Begin cetirizine  10 mg once a day as needed for runny nose and itching. Continue azelastine  2 sprays in each nostril up to twice a day if needed for a runny nose Continue saline nasal rinses as needed for nasal symptoms. Use this before any medicated nasal sprays for best result Continue allergen avoidance measures directed toward pollens, mold, and dust mites as listed below Continue allergen immunotherapy and have access to an epinephrine  autoinjector set  Hives (urticaria)/itch Take the least amount of medication while remaining hive and itch free Cetirizine  (Zyrtec ) 10mg  twice a day and famotidine (Pepcid) 20 mg twice a day. If no symptoms for 7-14 days then decrease to. Cetirizine  (Zyrtec ) 10mg  twice a day and famotidine (Pepcid) 20 mg once a day.  If no symptoms for 7-14 days then decrease to. Cetirizine  (Zyrtec ) 10mg  twice a day.  If no symptoms for 7-14 days then decrease to. Cetirizine  (Zyrtec ) 10mg  once a day  Keep a detailed symptom journal including foods eaten, contact with allergens, medications taken, weather changes.    Allergic conjunctivitis Some other over the counter eye drops include Pataday one drop in each eye once a day as needed for red, itchy eyes OR Zaditor one drop in each eye twice a day as needed for red itchy eyes.  Reflux Continue dietary and lifestyle modifications as listed below Continue omeprazole 20 mg once a day to control reflux.    Please let us  know if this treatment plan  is not working well for you.  Follow up in 3 months or sooner if needed.  Reducing Pollen Exposure The American Academy of Allergy , Asthma and Immunology suggests the following steps to reduce your exposure to pollen during allergy  seasons. Do not hang sheets or clothing out to dry; pollen may collect on these items. Do not mow lawns or spend time around freshly cut grass; mowing stirs up pollen. Keep windows closed at night.  Keep car windows closed while driving. Minimize morning activities outdoors, a time when pollen counts are usually at their highest. Stay indoors as much as possible when pollen counts or humidity is high and on windy days when pollen tends to remain in the air longer. Use air conditioning when possible.  Many air conditioners have filters that trap the pollen spores. Use a HEPA room air filter to remove pollen form the indoor air you breathe.   Control of Mold Allergen Mold and fungi can grow on a variety of surfaces provided certain temperature and moisture conditions exist.  Outdoor molds grow on plants, decaying vegetation and soil.  The major outdoor mold, Alternaria and Cladosporium, are found in very high numbers during hot and dry conditions.  Generally, a late Summer - Fall peak is seen for common outdoor fungal spores.  Rain will temporarily lower outdoor mold spore count, but counts rise rapidly when the rainy period ends.  The most important indoor molds are Aspergillus and Penicillium.  Dark, humid and poorly ventilated basements are ideal sites for mold growth.  The  next most common sites of mold growth are the bathroom and the kitchen.  Outdoor [employer] Use air conditioning and keep windows closed Avoid exposure to decaying vegetation. Avoid leaf raking. Avoid grain handling. Consider wearing a face mask if working in moldy areas.  Indoor Mold Control Maintain humidity below 50%. Clean washable surfaces with 5% bleach solution. Remove sources e.g.  Contaminated carpets.   Control of Dust Mite Allergen Dust mites play a major role in allergic asthma and rhinitis. They occur in environments with high humidity wherever human skin is found. Dust mites absorb humidity from the atmosphere (ie, they do not drink) and feed on organic matter (including shed human and animal skin). Dust mites are a microscopic type of insect that you cannot see with the naked eye. High levels of dust mites have been detected from mattresses, pillows, carpets, upholstered furniture, bed covers, clothes, soft toys and any woven material. The principal allergen of the dust mite is found in its feces. A gram of dust may contain 1,000 mites and 250,000 fecal particles. Mite antigen is easily measured in the air during house cleaning activities. Dust mites do not bite and do not cause harm to humans, other than by triggering allergies/asthma.  Ways to decrease your exposure to dust mites in your home:  1. Encase mattresses, box springs and pillows with a mite-impermeable barrier or cover  2. Wash sheets, blankets and drapes weekly in hot water (130 F) with detergent and dry them in a dryer on the hot setting.  3. Have the room cleaned frequently with a vacuum cleaner and a damp dust-mop. For carpeting or rugs, vacuuming with a vacuum cleaner equipped with a high-efficiency particulate air (HEPA) filter. The dust mite allergic individual should not be in a room which is being cleaned and should wait 1 hour after cleaning before going into the room.  4. Do not sleep on upholstered furniture (eg, couches).  5. If possible removing carpeting, upholstered furniture and drapery from the home is ideal. Horizontal blinds should be eliminated in the rooms where the person spends the most time (bedroom, study, television room). Washable vinyl, roller-type shades are optimal.  6. Remove all non-washable stuffed toys from the bedroom. Wash stuffed toys weekly like sheets and blankets  above.  7. Reduce indoor humidity to less than 50%. Inexpensive humidity monitors can be purchased at most hardware stores. Do not use a humidifier as can make the problem worse and are not recommended.

## 2023-12-24 NOTE — Progress Notes (Signed)
 [address] [place] [address] Dept: [phone]  FOLLOW UP NOTE  Patient ID: Christian Carroll, male    DOB: 01/22/1976  Age: 48 y.o. MRN: [id number] Date of Office Visit: 12/24/2023  Assessment  Chief Complaint: Medication Reaction (Had injection on a Wednesday and started showing symptoms on Friday. Itchy under skin. Constant scratchy throat and drainage. Cough and phlegm. Did double antihistamines. Still having some symptoms. )  HPI Christian Carroll is a 48 year old male who presents to clinic for a follow-up visit.  He was last seen in this clinic on 09/24/2023 by Dr. Marinda for evaluation of asthma, allergic rhinitis, and reflux.    In the interim, he traveled out of the country and, due to tardiness from traveling, received a repeat dose of allergen immunotherapy on 12/12/2023.  He reports that 1 to 2 days after that injection, he began to experience symptoms including itch under his skin, scratchy throat, nasal congestion, intermittent sneezing, and copious postnasal drainage.  He reports that he did not experience rhinorrhea.  He reports that he performed at home COVID and influenza testing that was negative.  He denies fever, sweats, chills, or sick contacts. He continues montelukast  10 mg once a day, Xhance  1 spray in the morning and 1 spray in the evening, Flonase  1 spray in the morning, 1 spray in the evening, and azelastine  2 sprays twice a day.  He occasionally uses nasal saline rinses.  He he began allergen immunotherapy directed toward grass pollen, weed pollen, tree pollen, mold, and dust mite on 10/17/2023.  He reports that he has not experienced such severe symptoms since beginning allergen immunotherapy.  We discussed switching antihistamines as well as doubling antihistamine on the day before the injection, day of the injection, and possibly day after the injection for now with the goal of taking a daily antihistamine and as few additional antihistamines as possible while  remaining itch free.  He reports that he occasionally experiences papular urticaria that mainly occurs on his hands while washing dishes without gloves. He normally wears gloves. He continues a daily antihistamine with moderate relief of itch.  Asthma is reported as well-controlled with cough producing copious mucus that began with the allergy  symptoms on Friday.  He denies shortness of breath or wheeze with activity or rest.  He continues montelukast  10 mg once a day, Symbicort  80-2 puffs twice a day, and rarely needs to use his albuterol .  Allergic conjunctivitis is reported as well-controlled with no issues at this time.  He rarely needs to use an over-the-counter allergy  eyedrop.    Reflux is reported as well-controlled with over the counter Prilosec every other day with relief of symptoms.    His current medications are listed in the chart.   Drug Allergies:  No Active Allergies  Physical Exam: BP 132/88 (BP Location: Left Arm, Patient Position: Sitting, Cuff Size: Normal)   Pulse 67   Temp 98.2 F (36.8 C) (Temporal)   Resp 18   SpO2 97%    Physical Exam Vitals reviewed.  Constitutional:      Appearance: Normal appearance.  HENT:     Head: Normocephalic and atraumatic.     Right Ear: Tympanic membrane normal.     Left Ear: Tympanic membrane normal.     Nose:     Comments: Bilateral nares slightly erythematous with thin clear nasal drainage noted.  Pharynx normal.  Ears normal.  Eyes normal.    Mouth/Throat:     Pharynx: Oropharynx is clear.  Eyes:     Conjunctiva/sclera: Conjunctivae normal.  Cardiovascular:     Rate and Rhythm: Normal rate and regular rhythm.     Heart sounds: Normal heart sounds. No murmur heard. Pulmonary:     Effort: Pulmonary effort is normal.     Breath sounds: Normal breath sounds.     Comments: Lungs clear to auscultation Musculoskeletal:        General: Normal range of motion.     Cervical back: Normal range of motion and neck supple.   Skin:    General: Skin is warm and dry.  Neurological:     Mental Status: He is alert and oriented to person, place, and time.  Psychiatric:        Mood and Affect: Mood normal.        Behavior: Behavior normal.        Thought Content: Thought content normal.        Judgment: Judgment normal.     Diagnostics: FVC is 6.37 which is 121% of predicted value, FEV1 4.56 which is 110% of predicted value.  Spirometry indicates normal ventilatory function.  Assessment and Plan: 1. Seasonal and perennial allergic rhinitis   2. Moderate persistent asthma without complication   3. Gastroesophageal reflux disease, unspecified whether esophagitis present   4. Allergic conjunctivitis of both eyes   5. Allergic urticaria     No orders of the defined types were placed in this encounter.   Patient Instructions  Asthma Continue Symbicort  80-2 puffs twice a day with a spacer to prevent cough or wheeze.  Continue montelukast  10 mg once a day to help prevent cough and wheeze Continue albuterol  2 puffs every 4 hours as needed for coughing, wheezing, tightness in chest or shortness of breath. Continue albuterol  2 puffs 5 to 15 minutes prior to exercise.  Allergic rhinitis Continue Xhance  1-2 sprays in each nostril twice a day for nasal congestion. Continue to follow up with your eye doctor Begin cetirizine  10 mg once a day as needed for runny nose and itching. Continue azelastine  2 sprays in each nostril up to twice a day if needed for a runny nose Continue saline nasal rinses as needed for nasal symptoms. Use this before any medicated nasal sprays for best result Continue allergen avoidance measures directed toward pollens, mold, and dust mites as listed below Continue allergen immunotherapy and have access to an epinephrine  autoinjector set  Hives (urticaria) Take the least amount of medication while remaining hive and itch free Cetirizine  (Zyrtec ) 10mg  twice a day and famotidine (Pepcid) 20  mg twice a day. If no symptoms for 7-14 days then decrease to. Cetirizine  (Zyrtec ) 10mg  twice a day and famotidine (Pepcid) 20 mg once a day.  If no symptoms for 7-14 days then decrease to. Cetirizine  (Zyrtec ) 10mg  twice a day.  If no symptoms for 7-14 days then decrease to. Cetirizine  (Zyrtec ) 10mg  once a day  Keep a detailed symptom journal including foods eaten, contact with allergens, medications taken, weather changes.    Allergic conjunctivitis Some other over the counter eye drops include Pataday one drop in each eye once a day as needed for red, itchy eyes OR Zaditor one drop in each eye twice a day as needed for red itchy eyes.  Reflux Continue dietary and lifestyle modifications as listed below Continue omeprazole 20 mg once a day to control reflux.    Please let us  know if this treatment plan is not working well for you.  Follow up in 3  months or sooner if needed.  Return in about 3 months (around 03/25/2024), or if symptoms worsen or fail to improve.    Thank you for the opportunity to care for this patient.  Please do not hesitate to contact me with questions.  Arlean Mutter, FNP [facility] 

## 2023-12-26 ENCOUNTER — Ambulatory Visit (INDEPENDENT_AMBULATORY_CARE_PROVIDER_SITE_OTHER)

## 2023-12-26 DIAGNOSIS — J309 Allergic rhinitis, unspecified: Secondary | ICD-10-CM | POA: Diagnosis not present

## 2024-01-08 ENCOUNTER — Ambulatory Visit (INDEPENDENT_AMBULATORY_CARE_PROVIDER_SITE_OTHER)

## 2024-01-08 DIAGNOSIS — J309 Allergic rhinitis, unspecified: Secondary | ICD-10-CM | POA: Diagnosis not present

## 2024-01-16 ENCOUNTER — Ambulatory Visit (INDEPENDENT_AMBULATORY_CARE_PROVIDER_SITE_OTHER)

## 2024-01-16 DIAGNOSIS — J309 Allergic rhinitis, unspecified: Secondary | ICD-10-CM | POA: Diagnosis not present

## 2024-01-21 ENCOUNTER — Ambulatory Visit (INDEPENDENT_AMBULATORY_CARE_PROVIDER_SITE_OTHER)

## 2024-01-21 DIAGNOSIS — J309 Allergic rhinitis, unspecified: Secondary | ICD-10-CM

## 2024-01-26 ENCOUNTER — Other Ambulatory Visit: Payer: Self-pay | Admitting: Internal Medicine

## 2024-01-28 ENCOUNTER — Encounter: Payer: Self-pay | Admitting: Internal Medicine

## 2024-01-28 MED ORDER — XHANCE 93 MCG/ACT NA EXHU: INHALANT_SUSPENSION | NASAL | 2 refills | Status: DC

## 2024-01-29 ENCOUNTER — Ambulatory Visit (INDEPENDENT_AMBULATORY_CARE_PROVIDER_SITE_OTHER)

## 2024-01-29 DIAGNOSIS — J309 Allergic rhinitis, unspecified: Secondary | ICD-10-CM | POA: Diagnosis not present

## 2024-02-06 ENCOUNTER — Ambulatory Visit (INDEPENDENT_AMBULATORY_CARE_PROVIDER_SITE_OTHER)

## 2024-02-06 DIAGNOSIS — J309 Allergic rhinitis, unspecified: Secondary | ICD-10-CM

## 2024-02-12 ENCOUNTER — Ambulatory Visit (INDEPENDENT_AMBULATORY_CARE_PROVIDER_SITE_OTHER)

## 2024-02-12 DIAGNOSIS — J309 Allergic rhinitis, unspecified: Secondary | ICD-10-CM

## 2024-02-19 ENCOUNTER — Ambulatory Visit (INDEPENDENT_AMBULATORY_CARE_PROVIDER_SITE_OTHER)

## 2024-02-19 DIAGNOSIS — J309 Allergic rhinitis, unspecified: Secondary | ICD-10-CM | POA: Diagnosis not present

## 2024-02-24 ENCOUNTER — Other Ambulatory Visit: Payer: Self-pay | Admitting: Internal Medicine

## 2024-02-27 ENCOUNTER — Ambulatory Visit (INDEPENDENT_AMBULATORY_CARE_PROVIDER_SITE_OTHER)

## 2024-02-27 DIAGNOSIS — J309 Allergic rhinitis, unspecified: Secondary | ICD-10-CM | POA: Diagnosis not present

## 2024-03-03 ENCOUNTER — Ambulatory Visit (INDEPENDENT_AMBULATORY_CARE_PROVIDER_SITE_OTHER)

## 2024-03-03 DIAGNOSIS — J309 Allergic rhinitis, unspecified: Secondary | ICD-10-CM

## 2024-03-08 ENCOUNTER — Other Ambulatory Visit: Payer: Self-pay | Admitting: Internal Medicine

## 2024-03-11 ENCOUNTER — Ambulatory Visit (INDEPENDENT_AMBULATORY_CARE_PROVIDER_SITE_OTHER)

## 2024-03-11 DIAGNOSIS — J309 Allergic rhinitis, unspecified: Secondary | ICD-10-CM | POA: Diagnosis not present

## 2024-03-17 ENCOUNTER — Encounter: Payer: Self-pay | Admitting: Internal Medicine

## 2024-03-17 ENCOUNTER — Ambulatory Visit: Admitting: Internal Medicine

## 2024-03-17 VITALS — BP 126/80 | HR 61 | Temp 98.4°F | Resp 18 | Ht 70.5 in | Wt 171.1 lb

## 2024-03-17 DIAGNOSIS — H1013 Acute atopic conjunctivitis, bilateral: Secondary | ICD-10-CM | POA: Diagnosis not present

## 2024-03-17 DIAGNOSIS — J3089 Other allergic rhinitis: Secondary | ICD-10-CM | POA: Diagnosis not present

## 2024-03-17 DIAGNOSIS — J302 Other seasonal allergic rhinitis: Secondary | ICD-10-CM | POA: Diagnosis not present

## 2024-03-17 DIAGNOSIS — J4540 Moderate persistent asthma, uncomplicated: Secondary | ICD-10-CM

## 2024-03-17 DIAGNOSIS — K219 Gastro-esophageal reflux disease without esophagitis: Secondary | ICD-10-CM | POA: Diagnosis not present

## 2024-03-17 DIAGNOSIS — L500 Allergic urticaria: Secondary | ICD-10-CM

## 2024-03-17 MED ORDER — MONTELUKAST SODIUM 10 MG PO TABS: ORAL_TABLET | ORAL | 1 refills | Status: AC

## 2024-03-17 MED ORDER — SYMBICORT 80-4.5 MCG/ACT IN AERO: 2.0000 | INHALATION_SPRAY | Freq: Two times a day (BID) | RESPIRATORY_TRACT | 5 refills | Status: AC

## 2024-03-17 MED ORDER — XHANCE 93 MCG/ACT NA EXHU: 2.0000 | INHALANT_SUSPENSION | Freq: Two times a day (BID) | NASAL | 5 refills | Status: DC | PRN

## 2024-03-17 MED ORDER — ALBUTEROL SULFATE HFA 108 (90 BASE) MCG/ACT IN AERS: INHALATION_SPRAY | RESPIRATORY_TRACT | 2 refills | Status: AC

## 2024-03-17 MED ORDER — AZELASTINE HCL 137 MCG/SPRAY NA SOLN: 2.0000 | Freq: Two times a day (BID) | NASAL | 5 refills | Status: AC | PRN

## 2024-03-17 NOTE — Patient Instructions (Addendum)
 Asthma Breathing test looks good today Continue Symbicort  80-2 puffs twice a day with a spacer to prevent cough or wheeze.  Continue montelukast  10 mg once a day to help prevent cough and wheeze Continue albuterol  2 puffs every 4 hours as needed for coughing, wheezing, tightness in chest or shortness of breath. Continue albuterol  2 puffs 5 to 15 minutes prior to exercise.  Allergic rhinitis Continue Xhance  1-2 sprays in each nostril twice a day for nasal congestion. Continue to follow up with your eye doctor Continue cetirizine  10 mg once a day as needed for runny nose and itching. Continue azelastine  2 sprays in each nostril up to twice a day if needed for a runny nose Continue saline nasal rinses as needed for nasal symptoms. Use this before any medicated nasal sprays for best result Continue allergen avoidance measures directed toward pollens, mold, and dust mites as listed below Continue allergen immunotherapy and have access to an epinephrine  autoinjector set  Hives (urticaria)/itch Take the least amount of medication while remaining hive and itch free Cetirizine  (Zyrtec ) 10mg  twice a day and famotidine (Pepcid) 20 mg twice a day. If no symptoms for 7-14 days then decrease to. Cetirizine  (Zyrtec ) 10mg  twice a day and famotidine (Pepcid) 20 mg once a day.  If no symptoms for 7-14 days then decrease to. Cetirizine  (Zyrtec ) 10mg  twice a day.  If no symptoms for 7-14 days then decrease to. Cetirizine  (Zyrtec ) 10mg  once a day  Keep a detailed symptom journal including foods eaten, contact with allergens, medications taken, weather changes.    Allergic conjunctivitis Some other over the counter eye drops include Pataday one drop in each eye once a day as needed for red, itchy eyes OR Zaditor one drop in each eye twice a day as needed for red itchy eyes.  Reflux Continue dietary and lifestyle modifications as listed below Continue omeprazole 20 mg once a day as needed to control reflux.     Please let us  know if this treatment plan is not working well for you.  Follow up in 6 months or sooner if needed. It was a pleasure seeing you again in clinic today! Thank you for allowing me to participate in your care.  Rocky Endow, MD [facility]   Reducing Pollen Exposure The American Academy of Allergy , Asthma and Immunology suggests the following steps to reduce your exposure to pollen during allergy  seasons. Do not hang sheets or clothing out to dry; pollen may collect on these items. Do not mow lawns or spend time around freshly cut grass; mowing stirs up pollen. Keep windows closed at night.  Keep car windows closed while driving. Minimize morning activities outdoors, a time when pollen counts are usually at their highest. Stay indoors as much as possible when pollen counts or humidity is high and on windy days when pollen tends to remain in the air longer. Use air conditioning when possible.  Many air conditioners have filters that trap the pollen spores. Use a HEPA room air filter to remove pollen form the indoor air you breathe.   Control of Mold Allergen Mold and fungi can grow on a variety of surfaces provided certain temperature and moisture conditions exist.  Outdoor molds grow on plants, decaying vegetation and soil.  The major outdoor mold, Alternaria and Cladosporium, are found in very high numbers during hot and dry conditions.  Generally, a late Summer - Fall peak is seen for common outdoor fungal spores.  Rain will temporarily lower  outdoor mold spore count, but counts rise rapidly when the rainy period ends.  The most important indoor molds are Aspergillus and Penicillium.  Dark, humid and poorly ventilated basements are ideal sites for mold growth.  The next most common sites of mold growth are the bathroom and the kitchen.  Outdoor [employer] Use air conditioning and keep windows closed Avoid exposure to decaying vegetation. Avoid leaf  raking. Avoid grain handling. Consider wearing a face mask if working in moldy areas.  Indoor Mold Control Maintain humidity below 50%. Clean washable surfaces with 5% bleach solution. Remove sources e.g. Contaminated carpets.   Control of Dust Mite Allergen Dust mites play a major role in allergic asthma and rhinitis. They occur in environments with high humidity wherever human skin is found. Dust mites absorb humidity from the atmosphere (ie, they do not drink) and feed on organic matter (including shed human and animal skin). Dust mites are a microscopic type of insect that you cannot see with the naked eye. High levels of dust mites have been detected from mattresses, pillows, carpets, upholstered furniture, bed covers, clothes, soft toys and any woven material. The principal allergen of the dust mite is found in its feces. A gram of dust may contain 1,000 mites and 250,000 fecal particles. Mite antigen is easily measured in the air during house cleaning activities. Dust mites do not bite and do not cause harm to humans, other than by triggering allergies/asthma.  Ways to decrease your exposure to dust mites in your home:  1. Encase mattresses, box springs and pillows with a mite-impermeable barrier or cover  2. Wash sheets, blankets and drapes weekly in hot water (130 F) with detergent and dry them in a dryer on the hot setting.  3. Have the room cleaned frequently with a vacuum cleaner and a damp dust-mop. For carpeting or rugs, vacuuming with a vacuum cleaner equipped with a high-efficiency particulate air (HEPA) filter. The dust mite allergic individual should not be in a room which is being cleaned and should wait 1 hour after cleaning before going into the room.  4. Do not sleep on upholstered furniture (eg, couches).  5. If possible removing carpeting, upholstered furniture and drapery from the home is ideal. Horizontal blinds should be eliminated in the rooms where the person  spends the most time (bedroom, study, television room). Washable vinyl, roller-type shades are optimal.  6. Remove all non-washable stuffed toys from the bedroom. Wash stuffed toys weekly like sheets and blankets above.  7. Reduce indoor humidity to less than 50%. Inexpensive humidity monitors can be purchased at most hardware stores. Do not use a humidifier as can make the problem worse and are not recommended.

## 2024-03-17 NOTE — Progress Notes (Signed)
 FOLLOW UP Date of Service/Encounter:   03/17/2024  Subjective:  Christian Carroll (DOB: [birth year]) is a 48 y.o. male who [facility] in re-evaluation of the following: allergic rhinoconjunctivitis on AIT, asthma, GERD History obtained from: chart review and patient.  For Review, LV was on 12/24/23  with Arlean Mutter, FNP seen for routine follow-up. See below for summary of history and diagnostics.   Therapeutic plans/changes recommended: switched from xyzal  to zyrtec . ----------------------------------------------------- Pertinent History/Diagnostics:  Asthma:  Failed Flovent . Improved on ICS/LABA combo.  Cold air as a trigger. Allergic rhinitis:  Has reflux, partially controlled with diet and omeprazole PRN. Has concerns regarding side effects so avoids daily. environmental allergy  skin testing was on 05/06/2019 and was positive to pollens, mold, and dust mites  Allergy  testing 09/2023 positive to grass pollen, weed pollen, tree pollen, indoor and outdoor molds and dust mite  AIT started 10/17/23.receives 2 vials (vial 1 G/W/T and vial 2 M/DM)  --------------------------------------------------- Today presents for follow-up. Discussed the use of AI scribe software for clinical note transcription with the patient, who gave verbal consent to proceed.  History of Present Illness Christian Carroll is a 48 year old male with allergies and asthma who presents for follow-up on allergy  shots and management of symptoms.  Allergic rhinitis and immunotherapy - Experienced a significant allergic reaction after returning from [place] in late June, with severe congestion and post-nasal drip following an allergy  shot. - Initially managed symptoms with a double dose of Xyzal , providing partial relief; symptoms persisted for nearly a week. - Switched to Zyrtec , resulting in improvement of symptoms. - Continues regular use of Xhance  and azelastine , with Xhance   being particularly effective. - Currently undergoing allergy  immunotherapy, has completed the green vial stage, and receives weekly allergy  shots. - Purchases some medications, such as Zyrtec  and Flonase , over-the-counter for cost-effectiveness.  Asthma control - Asthma is well-controlled with current regimen of Symbicort  and montelukast . - No recent need for rescue inhaler. - No requirement for prednisone or antibiotics since last visit.  Gastroesophageal reflux symptoms - Reflux managed with omeprazole every other day. - Occasional flare-ups associated with dietary triggers, such as garlic. - Uses over-the-counter antacids like Tums as needed.  Urticaria - Occasional hives in specific areas, managed with hydrocortisone cream. - Hives are controlled and not problematic.  Physical activity - Engages in regular physical activity, including tennis and running.  Chart Review: Last injection 03/11/24: 0.4 mL of green vial Today due for injection  All medications reviewed by clinical staff and updated in chart. No new pertinent medical or surgical history except as noted in HPI.  ROS: All others negative except as noted per HPI.   Objective:  BP 126/80 (BP Location: Left Arm, Patient Position: Sitting)   Pulse 61   Temp 98.4 F (36.9 C) (Temporal)   Resp 18   Ht 5' 10.5 (1.791 m)   Wt 171 lb 1.6 oz (77.6 kg)   SpO2 98%   BMI 24.20 kg/m  Body mass index is 24.2 kg/m. Physical Exam: General Appearance:  Alert, cooperative, no distress, appears stated age  Head:  Normocephalic, without obvious abnormality, atraumatic  Eyes:  Conjunctiva clear, EOM's intact  Ears EACs normal bilaterally and normal TMs bilaterally  Nose: Nares normal, hypertrophic turbinates, normal mucosa, and no visible anterior polyps  Throat: Lips, tongue normal; teeth and gums normal, normal posterior oropharynx  Neck: Supple, symmetrical  Lungs:   clear to auscultation bilaterally, Respirations  unlabored, no  coughing  Heart:  regular rate and rhythm and no murmur, Appears well perfused  Extremities: No edema  Skin: Skin color, texture, turgor normal and no rashes or lesions on visualized portions of skin  Neurologic: No gross deficits   Labs:  Lab Orders  No laboratory test(s) ordered today    Spirometry:  Tracings reviewed. His effort: Good reproducible efforts. FVC: 6.39L FEV1: 4.51L, 108% predicted FEV1/FVC ratio: 0.71 Interpretation: Spirometry consistent with normal pattern.  Please see scanned spirometry results for details.   Assessment/Plan   Asthma-at goal Breathing test looks good today Continue Symbicort  80-2 puffs twice a day with a spacer to prevent cough or wheeze.  Continue montelukast  10 mg once a day to help prevent cough and wheeze Continue albuterol  2 puffs every 4 hours as needed for coughing, wheezing, tightness in chest or shortness of breath. Continue albuterol  2 puffs 5 to 15 minutes prior to exercise.  Allergic rhinitis-at goal on AIT Continue Xhance  1-2 sprays in each nostril twice a day for nasal congestion. Continue to follow up with your eye doctor Continue cetirizine  10 mg once a day as needed for runny nose and itching. Continue azelastine  2 sprays in each nostril up to twice a day if needed for a runny nose Continue saline nasal rinses as needed for nasal symptoms. Use this before any medicated nasal sprays for best result Continue allergen avoidance measures directed toward pollens, mold, and dust mites as listed below Continue allergen immunotherapy and have access to an epinephrine  autoinjector set  Hives (urticaria)/itch-at goal Take the least amount of medication while remaining hive and itch free Cetirizine  (Zyrtec ) 10mg  twice a day and famotidine (Pepcid) 20 mg twice a day. If no symptoms for 7-14 days then decrease to. Cetirizine  (Zyrtec ) 10mg  twice a day and famotidine (Pepcid) 20 mg once a day.  If no symptoms for 7-14 days  then decrease to. Cetirizine  (Zyrtec ) 10mg  twice a day.  If no symptoms for 7-14 days then decrease to. Cetirizine  (Zyrtec ) 10mg  once a day  Keep a detailed symptom journal including foods eaten, contact with allergens, medications taken, weather changes.    Allergic conjunctivitis-at goal Some other over the counter eye drops include Pataday one drop in each eye once a day as needed for red, itchy eyes OR Zaditor one drop in each eye twice a day as needed for red itchy eyes.  Reflux-at goal Continue dietary and lifestyle modifications as listed below Continue omeprazole 20 mg once a day as needed to control reflux.    Please let us  know if this treatment plan is not working well for you.  Follow up in 6 months or sooner if needed. It was a pleasure seeing you again in clinic today! Thank you for allowing me to participate in your care.  Rocky Endow, MD [facility]  Other: allergy  injection given in clinic today  Rocky Endow, MD  [facility] 

## 2024-03-24 ENCOUNTER — Ambulatory Visit: Admitting: Internal Medicine

## 2024-03-26 ENCOUNTER — Ambulatory Visit (INDEPENDENT_AMBULATORY_CARE_PROVIDER_SITE_OTHER)

## 2024-03-26 DIAGNOSIS — J309 Allergic rhinitis, unspecified: Secondary | ICD-10-CM

## 2024-03-31 ENCOUNTER — Ambulatory Visit (INDEPENDENT_AMBULATORY_CARE_PROVIDER_SITE_OTHER)

## 2024-03-31 DIAGNOSIS — J309 Allergic rhinitis, unspecified: Secondary | ICD-10-CM | POA: Diagnosis not present

## 2024-04-07 ENCOUNTER — Ambulatory Visit (INDEPENDENT_AMBULATORY_CARE_PROVIDER_SITE_OTHER)

## 2024-04-07 DIAGNOSIS — J309 Allergic rhinitis, unspecified: Secondary | ICD-10-CM | POA: Diagnosis not present

## 2024-04-15 ENCOUNTER — Ambulatory Visit (INDEPENDENT_AMBULATORY_CARE_PROVIDER_SITE_OTHER)

## 2024-04-15 DIAGNOSIS — J309 Allergic rhinitis, unspecified: Secondary | ICD-10-CM | POA: Diagnosis not present

## 2024-04-23 ENCOUNTER — Ambulatory Visit (INDEPENDENT_AMBULATORY_CARE_PROVIDER_SITE_OTHER)

## 2024-04-23 DIAGNOSIS — J309 Allergic rhinitis, unspecified: Secondary | ICD-10-CM

## 2024-04-30 ENCOUNTER — Ambulatory Visit

## 2024-04-30 DIAGNOSIS — J309 Allergic rhinitis, unspecified: Secondary | ICD-10-CM | POA: Diagnosis not present

## 2024-05-02 ENCOUNTER — Telehealth: Payer: Self-pay

## 2024-05-02 NOTE — Telephone Encounter (Signed)
*  [facility]  Pharmacy Patient Advocate Encounter   Received notification from Fax that prior authorization for Xhance  93MCG/ACT exhaler suspension   is required/requested.   Insurance verification completed.   The patient is insured through [facility].   Per test claim: PA required; PA started via CoverMyMeds. KEY [id number] . Please see clinical question(s) below that I am not finding the answer to in their chart and advise.   What nasal spray is patient using at this time? Multiple messages and notes state patient is purchasing Flonase  over the counter and using Azelastine  and Xhance . Please clarify what therapy patient is currently on/continuing on.

## 2024-05-02 NOTE — Telephone Encounter (Signed)
 Patient is using Xhance  because it is most effective, but he alternates with regular Flonase , which is not as effective, due to cost.

## 2024-05-02 NOTE — Telephone Encounter (Signed)
 He has been paying out of pocket because it is what works best for him.

## 2024-05-02 NOTE — Telephone Encounter (Signed)
 Information submitted to plan and is pending determination.

## 2024-05-02 NOTE — Telephone Encounter (Signed)
 The requested medication and/or diagnosis are not a covered benefit and excluded from coverage in  accordance with the terms and conditions of your plan benefit. Therefore, the request has been  administratively denied.

## 2024-05-05 ENCOUNTER — Ambulatory Visit

## 2024-05-05 DIAGNOSIS — J309 Allergic rhinitis, unspecified: Secondary | ICD-10-CM | POA: Diagnosis not present

## 2024-05-05 NOTE — Telephone Encounter (Signed)
 Patient paying out of pocket. Nothing further needed.

## 2024-05-12 ENCOUNTER — Ambulatory Visit

## 2024-05-12 DIAGNOSIS — J309 Allergic rhinitis, unspecified: Secondary | ICD-10-CM

## 2024-05-19 ENCOUNTER — Ambulatory Visit

## 2024-05-19 DIAGNOSIS — J309 Allergic rhinitis, unspecified: Secondary | ICD-10-CM

## 2024-05-21 ENCOUNTER — Telehealth: Payer: Self-pay | Admitting: Internal Medicine

## 2024-05-21 NOTE — Telephone Encounter (Signed)
 Pharmacy called and stated they have faxed us  on 11/24, 11/25, and 12/2 regarding the prescription for  Xhance  nasal spray. She states we need to look into this, or send a new prescription. If questions call [phone]

## 2024-05-22 MED ORDER — XHANCE 93 MCG/ACT NA EXHU: 2.0000 | INHALANT_SUSPENSION | Freq: Two times a day (BID) | NASAL | 5 refills | Status: AC | PRN

## 2024-05-22 NOTE — Addendum Note (Signed)
 Addended by: ONEITA CHRISTIANS D on: 05/22/2024 02:24 PM   Modules accepted: Orders

## 2024-05-22 NOTE — Telephone Encounter (Signed)
 Refill for xhance  sent to [facility].

## 2024-05-26 ENCOUNTER — Ambulatory Visit (INDEPENDENT_AMBULATORY_CARE_PROVIDER_SITE_OTHER)

## 2024-05-26 DIAGNOSIS — J309 Allergic rhinitis, unspecified: Secondary | ICD-10-CM

## 2024-05-29 DIAGNOSIS — J301 Allergic rhinitis due to pollen: Secondary | ICD-10-CM | POA: Diagnosis not present

## 2024-05-29 NOTE — Progress Notes (Signed)
 VIALS MADE ON 05/29/24

## 2024-05-30 DIAGNOSIS — J302 Other seasonal allergic rhinitis: Secondary | ICD-10-CM | POA: Diagnosis not present

## 2024-05-30 DIAGNOSIS — J3089 Other allergic rhinitis: Secondary | ICD-10-CM | POA: Diagnosis not present

## 2024-06-09 ENCOUNTER — Ambulatory Visit

## 2024-06-09 DIAGNOSIS — J309 Allergic rhinitis, unspecified: Secondary | ICD-10-CM | POA: Diagnosis not present

## 2024-06-17 ENCOUNTER — Encounter: Payer: Self-pay | Admitting: Internal Medicine

## 2024-06-23 DIAGNOSIS — J309 Allergic rhinitis, unspecified: Secondary | ICD-10-CM

## 2024-06-23 NOTE — Telephone Encounter (Signed)
 Please let him know that I am not aware of any other pharmacies that would have better pricing. He can look into to GoodRx. If this is not affordable, he will have to go back to using flonase  2 sprays daily or he can alternate like he has been doing with Xhance  to make it a little more afordable.

## 2024-07-07 ENCOUNTER — Ambulatory Visit

## 2024-07-07 DIAGNOSIS — J302 Other seasonal allergic rhinitis: Secondary | ICD-10-CM

## 2024-07-22 ENCOUNTER — Ambulatory Visit

## 2024-07-22 DIAGNOSIS — J302 Other seasonal allergic rhinitis: Secondary | ICD-10-CM

## 2024-09-01 ENCOUNTER — Ambulatory Visit: Admitting: Internal Medicine
